# Patient Record
Sex: Male | Born: 1995 | Hispanic: Yes | Marital: Single | Smoking: Never smoker
Health system: Southern US, Community
[De-identification: ages and names within clinical notes are randomized; demographics above are authoritative.]

## PROBLEM LIST (undated history)

## (undated) DIAGNOSIS — F32A Depression, unspecified: Secondary | ICD-10-CM

## (undated) DIAGNOSIS — F419 Anxiety disorder, unspecified: Secondary | ICD-10-CM

## (undated) DIAGNOSIS — F329 Major depressive disorder, single episode, unspecified: Secondary | ICD-10-CM

## (undated) HISTORY — DX: Anxiety disorder, unspecified: F41.9

## (undated) HISTORY — DX: Depression, unspecified: F32.A

## (undated) HISTORY — PX: ANKLE SURGERY: SHX546

---
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
## 2019-02-09 ENCOUNTER — Encounter: Payer: Self-pay | Admitting: Family Medicine

## 2019-02-09 ENCOUNTER — Other Ambulatory Visit: Payer: Self-pay

## 2019-02-09 ENCOUNTER — Ambulatory Visit (INDEPENDENT_AMBULATORY_CARE_PROVIDER_SITE_OTHER): Payer: Managed Care, Other (non HMO) | Admitting: Family Medicine

## 2019-02-09 VITALS — BP 110/78 | HR 58 | Temp 97.9°F | Ht 73.0 in | Wt 192.0 lb

## 2019-02-09 DIAGNOSIS — F419 Anxiety disorder, unspecified: Secondary | ICD-10-CM

## 2019-02-09 DIAGNOSIS — F329 Major depressive disorder, single episode, unspecified: Secondary | ICD-10-CM | POA: Diagnosis not present

## 2019-02-09 DIAGNOSIS — Z7289 Other problems related to lifestyle: Secondary | ICD-10-CM | POA: Diagnosis not present

## 2019-02-09 DIAGNOSIS — Z7689 Persons encountering health services in other specified circumstances: Secondary | ICD-10-CM

## 2019-02-09 DIAGNOSIS — F32A Depression, unspecified: Secondary | ICD-10-CM

## 2019-02-09 NOTE — Patient Instructions (Signed)
Living With Depression Everyone experiences occasional disappointment, sadness, and loss in their lives. When you are feeling down, blue, or sad for at least 2 weeks in a row, it may mean that you have depression. Depression can affect your thoughts and feelings, relationships, daily activities, and physical health. It is caused by changes in the way your brain functions. If you receive a diagnosis of depression, your health care provider will tell you which type of depression you have and what treatment options are available to you. If you are living with depression, there are ways to help you recover from it and also ways to prevent it from coming back. How to cope with lifestyle changes Coping with stress     Stress is your body's reaction to life changes and events, both good and bad. Stressful situations may include:  Getting married.  The death of a spouse.  Losing a job.  Retiring.  Having a baby. Stress can last just a few hours or it can be ongoing. Stress can play a major role in depression, so it is important to learn both how to cope with stress and how to think about it differently. Talk with your health care provider or a counselor if you would like to learn more about stress reduction. He or she may suggest some stress reduction techniques, such as:  Music therapy. This can include creating music or listening to music. Choose music that you enjoy and that inspires you.  Mindfulness-based meditation. This kind of meditation can be done while sitting or walking. It involves being aware of your normal breaths, rather than trying to control your breathing.  Centering prayer. This is a kind of meditation that involves focusing on a spiritual word or phrase. Choose a word, phrase, or sacred image that is meaningful to you and that brings you peace.  Deep breathing. To do this, expand your stomach and inhale slowly through your nose. Hold your breath for 3-5 seconds, then exhale  slowly, allowing your stomach muscles to relax.  Muscle relaxation. This involves intentionally tensing muscles then relaxing them. Choose a stress reduction technique that fits your lifestyle and personality. Stress reduction techniques take time and practice to develop. Set aside 5-15 minutes a day to do them. Therapists can offer training in these techniques. The training may be covered by some insurance plans. Other things you can do to manage stress include:  Keeping a stress diary. This can help you learn what triggers your stress and ways to control your response.  Understanding what your limits are and saying no to requests or events that lead to a schedule that is too full.  Thinking about how you respond to certain situations. You may not be able to control everything, but you can control how you react.  Adding humor to your life by watching funny films or TV shows.  Making time for activities that help you relax and not feeling guilty about spending your time this way.  Medicines Your health care provider may suggest certain medicines if he or she feels that they will help improve your condition. Avoid using alcohol and other substances that may prevent your medicines from working properly (may interact). It is also important to:  Talk with your pharmacist or health care provider about all the medicines that you take, their possible side effects, and what medicines are safe to take together.  Make it your goal to take part in all treatment decisions (shared decision-making). This includes giving input on   the side effects of medicines. It is best if shared decision-making with your health care provider is part of your total treatment plan. If your health care provider prescribes a medicine, you may not notice the full benefits of it for 4-8 weeks. Most people who are treated for depression need to be on medicine for at least 6-12 months after they feel better. If you are taking  medicines as part of your treatment, do not stop taking medicines without first talking to your health care provider. You may need to have the medicine slowly decreased (tapered) over time to decrease the risk of harmful side effects. Relationships Your health care provider may suggest family therapy along with individual therapy and drug therapy. While there may not be family problems that are causing you to feel depressed, it is still important to make sure your family learns as much as they can about your mental health. Having your family's support can help make your treatment successful. How to recognize changes in your condition Everyone has a different response to treatment for depression. Recovery from major depression happens when you have not had signs of major depression for two months. This may mean that you will start to:  Have more interest in doing activities.  Feel less hopeless than you did 2 months ago.  Have more energy.  Overeat less often, or have better or improving appetite.  Have better concentration. Your health care provider will work with you to decide the next steps in your recovery. It is also important to recognize when your condition is getting worse. Watch for these signs:  Having fatigue or low energy.  Eating too much or too little.  Sleeping too much or too little.  Feeling restless, agitated, or hopeless.  Having trouble concentrating or making decisions.  Having unexplained physical complaints.  Feeling irritable, angry, or aggressive. Get help as soon as you or your family members notice these symptoms coming back. How to get support and help from others How to talk with friends and family members about your condition  Talking to friends and family members about your condition can provide you with one way to get support and guidance. Reach out to trusted friends or family members, explain your symptoms to them, and let them know that you are  working with a health care provider to treat your depression. Financial resources Not all insurance plans cover mental health care, so it is important to check with your insurance carrier. If paying for co-pays or counseling services is a problem, search for a local or county mental health care center. They may be able to offer public mental health care services at low or no cost when you are not able to see a private health care provider. If you are taking medicine for depression, you may be able to get the generic form, which may be less expensive. Some makers of prescription medicines also offer help to patients who cannot afford the medicines they need. Follow these instructions at home:   Get the right amount and quality of sleep.  Cut down on using caffeine, tobacco, alcohol, and other potentially harmful substances.  Try to exercise, such as walking or lifting small weights.  Take over-the-counter and prescription medicines only as told by your health care provider.  Eat a healthy diet that includes plenty of vegetables, fruits, whole grains, low-fat dairy products, and lean protein. Do not eat a lot of foods that are high in solid fats, added sugars, or salt.    Keep all follow-up visits as told by your health care provider. This is important. Contact a health care provider if:  You stop taking your antidepressant medicines, and you have any of these symptoms: ? Nausea. ? Headache. ? Feeling lightheaded. ? Chills and body aches. ? Not being able to sleep (insomnia).  You or your friends and family think your depression is getting worse. Get help right away if:  You have thoughts of hurting yourself or others. If you ever feel like you may hurt yourself or others, or have thoughts about taking your own life, get help right away. You can go to your nearest emergency department or call:  Your local emergency services (911 in the [place]  A suicide crisis helpline, such as the  National Suicide Prevention Lifeline at (709)243-8295. This is open 24-hours a day. Summary  If you are living with depression, there are ways to help you recover from it and also ways to prevent it from coming back.  Work with your health care team to create a management plan that includes counseling, stress management techniques, and healthy lifestyle habits. This information is not intended to replace advice given to you by your health care provider. Make sure you discuss any questions you have with your health care provider. Document Revised: 04/22/2018 Document Reviewed: 12/02/2015 Elsevier Patient Education  2020 [employer] Inc.  Managing Anxiety, Adult After being diagnosed with an anxiety disorder, you may be relieved to know why you have felt or behaved a certain way. You may also feel overwhelmed about the treatment ahead and what it will mean for your life. With care and support, you can manage this condition and recover from it. How to manage lifestyle changes Managing stress and anxiety  Stress is your body's reaction to life changes and events, both good and bad. Most stress will last just a few hours, but stress can be ongoing and can lead to more than just stress. Although stress can play a major role in anxiety, it is not the same as anxiety. Stress is usually caused by something external, such as a deadline, test, or competition. Stress normally passes after the triggering event has ended.  Anxiety is caused by something internal, such as imagining a terrible outcome or worrying that something will go wrong that will devastate you. Anxiety often does not go away even after the triggering event is over, and it can become long-term (chronic) worry. It is important to understand the differences between stress and anxiety and to manage your stress effectively so that it does not lead to an anxious response. Talk with your health care provider or a counselor to learn more about reducing  anxiety and stress. He or she may suggest tension reduction techniques, such as:  Music therapy. This can include creating or listening to music that you enjoy and that inspires you.  Mindfulness-based meditation. This involves being aware of your normal breaths while not trying to control your breathing. It can be done while sitting or walking.  Centering prayer. This involves focusing on a word, phrase, or sacred image that means something to you and brings you peace.  Deep breathing. To do this, expand your stomach and inhale slowly through your nose. Hold your breath for 3-5 seconds. Then exhale slowly, letting your stomach muscles relax.  Self-talk. This involves identifying thought patterns that lead to anxiety reactions and changing those patterns.  Muscle relaxation. This involves tensing muscles and then relaxing them. Choose a tension reduction technique that  suits your lifestyle and personality. These techniques take time and practice. Set aside 5-15 minutes a day to do them. Therapists can offer counseling and training in these techniques. The training to help with anxiety may be covered by some insurance plans. Other things you can do to manage stress and anxiety include:  Keeping a stress/anxiety diary. This can help you learn what triggers your reaction and then learn ways to manage your response.  Thinking about how you react to certain situations. You may not be able to control everything, but you can control your response.  Making time for activities that help you relax and not feeling guilty about spending your time in this way.  Visual imagery and yoga can help you stay calm and relax.  Medicines Medicines can help ease symptoms. Medicines for anxiety include:  Anti-anxiety drugs.  Antidepressants. Medicines are often used as a primary treatment for anxiety disorder. Medicines will be prescribed by a health care provider. When used together, medicines, psychotherapy,  and tension reduction techniques may be the most effective treatment. Relationships Relationships can play a big part in helping you recover. Try to spend more time connecting with trusted friends and family members. Consider going to couples counseling, taking family education classes, or going to family therapy. Therapy can help you and others better understand your condition. How to recognize changes in your anxiety Everyone responds differently to treatment for anxiety. Recovery from anxiety happens when symptoms decrease and stop interfering with your daily activities at home or work. This may mean that you will start to:  Have better concentration and focus. Worry will interfere less in your daily thinking.  Sleep better.  Be less irritable.  Have more energy.  Have improved memory. It is important to recognize when your condition is getting worse. Contact your health care provider if your symptoms interfere with home or work and you feel like your condition is not improving. Follow these instructions at home: Activity  Exercise. Most adults should do the following: ? Exercise for at least 150 minutes each week. The exercise should increase your heart rate and make you sweat (moderate-intensity exercise). ? Strengthening exercises at least twice a week.  Get the right amount and quality of sleep. Most adults need 7-9 hours of sleep each night. Lifestyle   Eat a healthy diet that includes plenty of vegetables, fruits, whole grains, low-fat dairy products, and lean protein. Do not eat a lot of foods that are high in solid fats, added sugars, or salt.  Make choices that simplify your life.  Do not use any products that contain nicotine or tobacco, such as cigarettes, e-cigarettes, and chewing tobacco. If you need help quitting, ask your health care provider.  Avoid caffeine, alcohol, and certain over-the-counter cold medicines. These may make you feel worse. Ask your pharmacist  which medicines to avoid. General instructions  Take over-the-counter and prescription medicines only as told by your health care provider.  Keep all follow-up visits as told by your health care provider. This is important. Where to find support You can get help and support from these sources:  Self-help groups.  Online and [employer].  A trusted spiritual leader.  Couples counseling.  Family education classes.  Family therapy. Where to find more information You may find that joining a support group helps you deal with your anxiety. The following sources can help you locate counselors or support groups near you:  [place]: www.mentalhealth[place].net  Anxiety and Depression Association of [place] (  ADAA): ProgramCam.de  [employer] on Mental Illness (NAMI): www.nami.org Contact a health care provider if you:  Have a hard time staying focused or finishing daily tasks.  Spend many hours a day feeling worried about everyday life.  Become exhausted by worry.  Start to have headaches, feel tense, or have nausea.  Urinate more than normal.  Have diarrhea. Get help right away if you have:  A racing heart and shortness of breath.  Thoughts of hurting yourself or others. If you ever feel like you may hurt yourself or others, or have thoughts about taking your own life, get help right away. You can go to your nearest emergency department or call:  Your local emergency services (911 in the [place]  A suicide crisis helpline, such as the National Suicide Prevention Lifeline at 212-296-6198. This is open 24 hours a day. Summary  Taking steps to learn and use tension reduction techniques can help calm you and help prevent triggering an anxiety reaction.  When used together, medicines, psychotherapy, and tension reduction techniques may be the most effective treatment.  Family, friends, and partners can play a big part in helping you recover  from an anxiety disorder. This information is not intended to replace advice given to you by your health care provider. Make sure you discuss any questions you have with your health care provider. Document Revised: 05/31/2018 Document Reviewed: 05/31/2018 Elsevier Patient Education  2020 [employer].

## 2019-02-09 NOTE — Progress Notes (Signed)
Patient presents to clinic today to f/u on chronic issues and establish care.  SUBJECTIVE: PMH: Pt is a 24 yo male with pmh sig for depression.  Pt previously seen at [facility].  H/o depression: -states has symptoms off and on.   -worse during HS -endorses hospitalization x 1 wk -endorses h/o anger, anxiety, SI, and self harm in the past -currently denies SI/HI -in the past saw Psychiatry, but states he "does not trust them" -keeps "everything inside" -states does not need sleep.  May drink 2 Monster energy drinks per day if working -states mood is good.    Allergies: NKDA  Social history: Patient works-instructed to look infected.  Pt's shift is from  230 pm to 10 pm.  Pt endorses vaping.  States does not vape if drinks a monster.  Also notes MJ use.    Health Maintenance: Dental --[facility] Immunizations -- influenza vaccine 2020  Family medical history: Mom-alive Dad-alive, history of prostate cancer, diabetes, HLD, HTN Sister-Jennifer, alive, depression MGM-alive, arthritis, depression, heart disease, HLD, kidney disease, mental illness   Past Medical History:  Diagnosis Date  . Depression     No current outpatient medications on file prior to visit.   No current facility-administered medications on file prior to visit.    No Known Allergies  No family history on file.  Social History   Socioeconomic History  . Marital status: Unknown    Spouse name: Not on file  . Number of children: Not on file  . Years of education: Not on file  . Highest education level: Not on file  Occupational History  . Not on file  Tobacco Use  . Smoking status: Never Smoker  . Smokeless tobacco: Never Used  Substance and Sexual Activity  . Alcohol use: Never  . Drug use: Never  . Sexual activity: Not Currently  Other Topics Concern  . Not on file  Social History Narrative  . Not on file   Social Determinants of Health    Financial Resource Strain:   . Difficulty of Paying Living Expenses: Not on file  Food Insecurity:   . Worried About Charity fundraiser in the Last Year: Not on file  . Ran Out of Food in the Last Year: Not on file  Transportation Needs:   . Lack of Transportation (Medical): Not on file  . Lack of Transportation (Non-Medical): Not on file  Physical Activity:   . Days of Exercise per Week: Not on file  . Minutes of Exercise per Session: Not on file  Stress:   . Feeling of Stress : Not on file  Social Connections:   . Frequency of Communication with Friends and Family: Not on file  . Frequency of Social Gatherings with Friends and Family: Not on file  . Attends Religious Services: Not on file  . Active Member of Clubs or Organizations: Not on file  . Attends Archivist Meetings: Not on file  . Marital Status: Not on file  Intimate Partner Violence:   . Fear of Current or Ex-Partner: Not on file  . Emotionally Abused: Not on file  . Physically Abused: Not on file  . Sexually Abused: Not on file    ROS General: Denies fever, chills, night sweats, changes in weight, changes in appetite HEENT: Denies headaches, ear pain, changes in vision, rhinorrhea, sore throat CV: Denies CP, palpitations, SOB, orthopnea Pulm: Denies SOB, cough, wheezing GI: Denies abdominal pain, nausea,  vomiting, diarrhea, constipation GU: Denies dysuria, hematuria, frequency, vaginal discharge Msk: Denies muscle cramps, joint pains Neuro: Denies weakness, numbness, tingling Skin: Denies rashes, bruising Psych: Denies hallucinations  +depression, anxiety  BP 110/78 (BP Location: Left Arm, Patient Position: Sitting, Cuff Size: Normal)   Pulse (!) 58   Temp 97.9 F (36.6 C) (Temporal)   Ht 6\' 1"  (1.854 m)   Wt 192 lb (87.1 kg)   SpO2 98%   BMI 25.33 kg/m   Physical Exam Gen. Pleasant, well developed, well-nourished, in NAD HEENT - [place]/AT, PERRL, conjunctive clear, no scleral icterus, no  nasal drainage, pharynx without erythema or exudate.  TMs normal b/l. Lungs: no use of accessory muscles, CTAB, no wheezes, rales or rhonchi Cardiovascular: RRR, No r/g/m, no peripheral edema Abdomen: BS present, soft, nontender,nondistended Musculoskeletal: No deformities, moves all four extremities, no cyanosis or clubbing, normal tone Neuro:  A&Ox3, CN II-XII intact, normal gait Skin:  Warm, dry, intact, no lesions Psych: flat affect, mood appropriate  No results found for this or any previous visit (from the past 2160 hour(s)).  Assessment/Plan: Anxiety and depression -PHQ 9 score 7 -GAD 7 score 9 -discussed BH.  Pt declines. -continue to monitor -given precautions  Encounter to establish care -We reviewed the PMH, PSH, FH, SH, Meds and Allergies. -We provided refills for any medications we will prescribe as needed. -We addressed current concerns per orders and patient instructions. -We have asked for records for pertinent exams, studies, vaccines and notes from previous providers. -We have advised patient to follow up per instructions below.  Engages in vaping -discussed smoking cessation >3 min, <10 min -not interested in quitting at this time. -will continue to address at each visit  F/u in 1 month  2161, MD

## 2019-04-12 ENCOUNTER — Encounter: Payer: Managed Care, Other (non HMO) | Admitting: Family Medicine

## 2019-06-14 ENCOUNTER — Encounter: Payer: Managed Care, Other (non HMO) | Admitting: Family Medicine

## 2019-07-13 ENCOUNTER — Encounter: Payer: Self-pay | Admitting: Family Medicine

## 2019-07-13 ENCOUNTER — Other Ambulatory Visit: Payer: Self-pay

## 2019-07-13 ENCOUNTER — Ambulatory Visit (INDEPENDENT_AMBULATORY_CARE_PROVIDER_SITE_OTHER): Payer: Managed Care, Other (non HMO) | Admitting: Family Medicine

## 2019-07-13 VITALS — BP 108/72 | HR 74 | Temp 97.9°F | Ht 73.0 in | Wt 220.0 lb

## 2019-07-13 DIAGNOSIS — Z13220 Encounter for screening for lipoid disorders: Secondary | ICD-10-CM | POA: Diagnosis not present

## 2019-07-13 DIAGNOSIS — F321 Major depressive disorder, single episode, moderate: Secondary | ICD-10-CM

## 2019-07-13 DIAGNOSIS — K5909 Other constipation: Secondary | ICD-10-CM

## 2019-07-13 DIAGNOSIS — Z0001 Encounter for general adult medical examination with abnormal findings: Secondary | ICD-10-CM | POA: Diagnosis not present

## 2019-07-13 DIAGNOSIS — Z131 Encounter for screening for diabetes mellitus: Secondary | ICD-10-CM

## 2019-07-13 DIAGNOSIS — Z0000 Encounter for general adult medical examination without abnormal findings: Secondary | ICD-10-CM

## 2019-07-13 DIAGNOSIS — R4586 Emotional lability: Secondary | ICD-10-CM

## 2019-07-13 DIAGNOSIS — Z23 Encounter for immunization: Secondary | ICD-10-CM | POA: Diagnosis not present

## 2019-07-13 DIAGNOSIS — F419 Anxiety disorder, unspecified: Secondary | ICD-10-CM

## 2019-07-13 LAB — CBC WITH DIFFERENTIAL/PLATELET
Basophils Absolute: 0 10*3/uL (ref 0.0–0.1)
Basophils Relative: 0.7 % (ref 0.0–3.0)
Eosinophils Absolute: 0.1 10*3/uL (ref 0.0–0.7)
Eosinophils Relative: 2.5 % (ref 0.0–5.0)
HCT: 40.3 % (ref 39.0–52.0)
Hemoglobin: 13.4 g/dL (ref 13.0–17.0)
Lymphocytes Relative: 31.8 % (ref 12.0–46.0)
Lymphs Abs: 1.9 10*3/uL (ref 0.7–4.0)
MCHC: 33.2 g/dL (ref 30.0–36.0)
MCV: 81.8 fl (ref 78.0–100.0)
Monocytes Absolute: 0.5 10*3/uL (ref 0.1–1.0)
Monocytes Relative: 7.8 % (ref 3.0–12.0)
Neutro Abs: 3.5 10*3/uL (ref 1.4–7.7)
Neutrophils Relative %: 57.2 % (ref 43.0–77.0)
Platelets: 258 10*3/uL (ref 150.0–400.0)
RBC: 4.93 Mil/uL (ref 4.22–5.81)
RDW: 14 % (ref 11.5–15.5)
WBC: 6.1 10*3/uL (ref 4.0–10.5)

## 2019-07-13 LAB — COMPREHENSIVE METABOLIC PANEL
ALT: 54 U/L — ABNORMAL HIGH (ref 0–53)
AST: 33 U/L (ref 0–37)
Albumin: 4.3 g/dL (ref 3.5–5.2)
Alkaline Phosphatase: 132 U/L — ABNORMAL HIGH (ref 39–117)
BUN: 9 mg/dL (ref 6–23)
CO2: 29 mEq/L (ref 19–32)
Calcium: 9 mg/dL (ref 8.4–10.5)
Chloride: 101 mEq/L (ref 96–112)
Creatinine, Ser: 0.56 mg/dL (ref 0.40–1.50)
GFR: 177.67 mL/min (ref >60.00)
Glucose, Bld: 88 mg/dL (ref 70–99)
Potassium: 4 mEq/L (ref 3.5–5.1)
Sodium: 137 mEq/L (ref 135–145)
Total Bilirubin: 0.9 mg/dL (ref 0.2–1.2)
Total Protein: 7.4 g/dL (ref 6.0–8.3)

## 2019-07-13 LAB — LIPID PANEL
Cholesterol: 185 mg/dL (ref 0–200)
HDL: 37.8 mg/dL — ABNORMAL LOW (ref >39.00)
LDL Cholesterol: 123 mg/dL — ABNORMAL HIGH (ref 0–99)
NonHDL: 147.44
Total CHOL/HDL Ratio: 5
Triglycerides: 121 mg/dL (ref 0.0–149.0)
VLDL: 24.2 mg/dL (ref 0.0–40.0)

## 2019-07-13 LAB — HEMOGLOBIN A1C: Hgb A1c MFr Bld: 5.7 % (ref 4.6–6.5)

## 2019-07-13 LAB — VITAMIN B12: Vitamin B-12: 362 pg/mL (ref 211–911)

## 2019-07-13 LAB — T4, FREE: Free T4: 0.81 ng/dL (ref 0.60–1.60)

## 2019-07-13 LAB — TSH: TSH: 1.51 u[IU]/mL (ref 0.35–4.50)

## 2019-07-13 NOTE — Patient Instructions (Addendum)
You can take MiraLAX as needed for constipation.  It can be found over-the-counter at your local drugstore.  Preventive Care 38-25 Years Old, Male Preventive care refers to lifestyle choices and visits with your health care provider that can promote health and wellness. This includes:  A yearly physical exam. This is also called an annual well check.  Regular dental and eye exams.  Immunizations.  Screening for certain conditions.  Healthy lifestyle choices, such as eating a healthy diet, getting regular exercise, not using drugs or products that contain nicotine and tobacco, and limiting alcohol use. What can I expect for my preventive care visit? Physical exam Your health care provider will check:  Height and weight. These may be used to calculate body mass index (BMI), which is a measurement that tells if you are at a healthy weight.  Heart rate and blood pressure.  Your skin for abnormal spots. Counseling Your health care provider may ask you questions about:  Alcohol, tobacco, and drug use.  Emotional well-being.  Home and relationship well-being.  Sexual activity.  Eating habits.  Work and work Statistician. What immunizations do I need?  Influenza (flu) vaccine  This is recommended every year. Tetanus, diphtheria, and pertussis (Tdap) vaccine  You may need a Td booster every 10 years. Varicella (chickenpox) vaccine  You may need this vaccine if you have not already been vaccinated. Human papillomavirus (HPV) vaccine  If recommended by your health care provider, you may need three doses over 6 months. Measles, mumps, and rubella (MMR) vaccine  You may need at least one dose of MMR. You may also need a second dose. Meningococcal conjugate (MenACWY) vaccine  One dose is recommended if you are 61-78 years old and a Market researcher living in a residence hall, or if you have one of several medical conditions. You may also need additional booster  doses. Pneumococcal conjugate (PCV13) vaccine  You may need this if you have certain conditions and were not previously vaccinated. Pneumococcal polysaccharide (PPSV23) vaccine  You may need one or two doses if you smoke cigarettes or if you have certain conditions. Hepatitis A vaccine  You may need this if you have certain conditions or if you travel or work in places where you may be exposed to hepatitis A. Hepatitis B vaccine  You may need this if you have certain conditions or if you travel or work in places where you may be exposed to hepatitis B. Haemophilus influenzae type b (Hib) vaccine  You may need this if you have certain risk factors. You may receive vaccines as individual doses or as more than one vaccine together in one shot (combination vaccines). Talk with your health care provider about the risks and benefits of combination vaccines. What tests do I need? Blood tests  Lipid and cholesterol levels. These may be checked every 5 years starting at age 29.  Hepatitis C test.  Hepatitis B test. Screening   Diabetes screening. This is done by checking your blood sugar (glucose) after you have not eaten for a while (fasting).  Sexually transmitted disease (STD) testing. Talk with your health care provider about your test results, treatment options, and if necessary, the need for more tests. Follow these instructions at home: Eating and drinking   Eat a diet that includes fresh fruits and vegetables, whole grains, lean protein, and low-fat dairy products.  Take vitamin and mineral supplements as recommended by your health care provider.  Do not drink alcohol if your health care  provider tells you not to drink.  If you drink alcohol: ? Limit how much you have to 0-2 drinks a day. ? Be aware of how much alcohol is in your drink. In the [place] one drink equals one 12 oz bottle of beer (355 mL), one 5 oz glass of wine (148 mL), or one 1 oz glass of hard liquor (44  mL). Lifestyle  Take daily care of your teeth and gums.  Stay active. Exercise for at least 30 minutes on 5 or more days each week.  Do not use any products that contain nicotine or tobacco, such as cigarettes, e-cigarettes, and chewing tobacco. If you need help quitting, ask your health care provider.  If you are sexually active, practice safe sex. Use a condom or other form of protection to prevent STIs (sexually transmitted infections). What's next?  Go to your health care provider once a year for a well check visit.  Ask your health care provider how often you should have your eyes and teeth checked.  Stay up to date on all vaccines. This information is not intended to replace advice given to you by your health care provider. Make sure you discuss any questions you have with your health care provider. Document Revised: 12/23/2017 Document Reviewed: 12/23/2017 Elsevier Patient Education  [place], Adult After being diagnosed with an anxiety disorder, you may be relieved to know why you have felt or behaved a certain way. You may also feel overwhelmed about the treatment ahead and what it will mean for your life. With care and support, you can manage this condition and recover from it. How to manage lifestyle changes Managing stress and anxiety  Stress is your body's reaction to life changes and events, both good and bad. Most stress will last just a few hours, but stress can be ongoing and can lead to more than just stress. Although stress can play a major role in anxiety, it is not the same as anxiety. Stress is usually caused by something external, such as a deadline, test, or competition. Stress normally passes after the triggering event has ended.  Anxiety is caused by something internal, such as imagining a terrible outcome or worrying that something will go wrong that will devastate you. Anxiety often does not go away even after the triggering event is  over, and it can become long-term (chronic) worry. It is important to understand the differences between stress and anxiety and to manage your stress effectively so that it does not lead to an anxious response. Talk with your health care provider or a counselor to learn more about reducing anxiety and stress. He or she may suggest tension reduction techniques, such as:  Music therapy. This can include creating or listening to music that you enjoy and that inspires you.  Mindfulness-based meditation. This involves being aware of your normal breaths while not trying to control your breathing. It can be done while sitting or walking.  Centering prayer. This involves focusing on a word, phrase, or sacred image that means something to you and brings you peace.  Deep breathing. To do this, expand your stomach and inhale slowly through your nose. Hold your breath for 3-5 seconds. Then exhale slowly, letting your stomach muscles relax.  Self-talk. This involves identifying thought patterns that lead to anxiety reactions and changing those patterns.  Muscle relaxation. This involves tensing muscles and then relaxing them. Choose a tension reduction technique that suits your lifestyle and personality. These techniques  take time and practice. Set aside 5-15 minutes a day to do them. Therapists can offer counseling and training in these techniques. The training to help with anxiety may be covered by some insurance plans. Other things you can do to manage stress and anxiety include:  Keeping a stress/anxiety diary. This can help you learn what triggers your reaction and then learn ways to manage your response.  Thinking about how you react to certain situations. You may not be able to control everything, but you can control your response.  Making time for activities that help you relax and not feeling guilty about spending your time in this way.  Visual imagery and yoga can help you stay calm and  relax.  Medicines Medicines can help ease symptoms. Medicines for anxiety include:  Anti-anxiety drugs.  Antidepressants. Medicines are often used as a primary treatment for anxiety disorder. Medicines will be prescribed by a health care provider. When used together, medicines, psychotherapy, and tension reduction techniques may be the most effective treatment. Relationships Relationships can play a big part in helping you recover. Try to spend more time connecting with trusted friends and family members. Consider going to couples counseling, taking family education classes, or going to family therapy. Therapy can help you and others better understand your condition. How to recognize changes in your anxiety Everyone responds differently to treatment for anxiety. Recovery from anxiety happens when symptoms decrease and stop interfering with your daily activities at home or work. This may mean that you will start to:  Have better concentration and focus. Worry will interfere less in your daily thinking.  Sleep better.  Be less irritable.  Have more energy.  Have improved memory. It is important to recognize when your condition is getting worse. Contact your health care provider if your symptoms interfere with home or work and you feel like your condition is not improving. Follow these instructions at home: Activity  Exercise. Most adults should do the following: ? Exercise for at least 150 minutes each week. The exercise should increase your heart rate and make you sweat (moderate-intensity exercise). ? Strengthening exercises at least twice a week.  Get the right amount and quality of sleep. Most adults need 7-9 hours of sleep each night. Lifestyle   Eat a healthy diet that includes plenty of vegetables, fruits, whole grains, low-fat dairy products, and lean protein. Do not eat a lot of foods that are high in solid fats, added sugars, or salt.  Make choices that simplify your  life.  Do not use any products that contain nicotine or tobacco, such as cigarettes, e-cigarettes, and chewing tobacco. If you need help quitting, ask your health care provider.  Avoid caffeine, alcohol, and certain over-the-counter cold medicines. These may make you feel worse. Ask your pharmacist which medicines to avoid. General instructions  Take over-the-counter and prescription medicines only as told by your health care provider.  Keep all follow-up visits as told by your health care provider. This is important. Where to find support You can get help and support from these sources:  Self-help groups.  Online and [employer].  A trusted spiritual leader.  Couples counseling.  Family education classes.  Family therapy. Where to find more information You may find that joining a support group helps you deal with your anxiety. The following sources can help you locate counselors or support groups near you:  [place]: www.mentalhealthamerica.net  Anxiety and Depression Association of [place] (ADAA): https://www.clark.net/  National Alliance on Mental  Illness (NAMI): www.nami.org Contact a health care provider if you:  Have a hard time staying focused or finishing daily tasks.  Spend many hours a day feeling worried about everyday life.  Become exhausted by worry.  Start to have headaches, feel tense, or have nausea.  Urinate more than normal.  Have diarrhea. Get help right away if you have:  A racing heart and shortness of breath.  Thoughts of hurting yourself or others. If you ever feel like you may hurt yourself or others, or have thoughts about taking your own life, get help right away. You can go to your nearest emergency department or call:  Your local emergency services (911 in the [place]  A suicide crisis helpline, such as the [facility] at (930) 009-1284. This is open 24 hours a day. Summary  Taking steps to  learn and use tension reduction techniques can help calm you and help prevent triggering an anxiety reaction.  When used together, medicines, psychotherapy, and tension reduction techniques may be the most effective treatment.  Family, friends, and partners can play a big part in helping you recover from an anxiety disorder. This information is not intended to replace advice given to you by your health care provider. Make sure you discuss any questions you have with your health care provider. Document Revised: 05/31/2018 Document Reviewed: 05/31/2018 Elsevier Patient Education  [place] With Depression Everyone experiences occasional disappointment, sadness, and loss in their lives. When you are feeling down, blue, or sad for at least 2 weeks in a row, it may mean that you have depression. Depression can affect your thoughts and feelings, relationships, daily activities, and physical health. It is caused by changes in the way your brain functions. If you receive a diagnosis of depression, your health care provider will tell you which type of depression you have and what treatment options are available to you. If you are living with depression, there are ways to help you recover from it and also ways to prevent it from coming back. How to cope with lifestyle changes Coping with stress     Stress is your body's reaction to life changes and events, both good and bad. Stressful situations may include:  Getting married.  The death of a spouse.  Losing a job.  Retiring.  Having a baby. Stress can last just a few hours or it can be ongoing. Stress can play a major role in depression, so it is important to learn both how to cope with stress and how to think about it differently. Talk with your health care provider or a counselor if you would like to learn more about stress reduction. He or she may suggest some stress reduction techniques, such as:  Music therapy. This can  include creating music or listening to music. Choose music that you enjoy and that inspires you.  Mindfulness-based meditation. This kind of meditation can be done while sitting or walking. It involves being aware of your normal breaths, rather than trying to control your breathing.  Centering prayer. This is a kind of meditation that involves focusing on a spiritual word or phrase. Choose a word, phrase, or sacred image that is meaningful to you and that brings you peace.  Deep breathing. To do this, expand your stomach and inhale slowly through your nose. Hold your breath for 3-5 seconds, then exhale slowly, allowing your stomach muscles to relax.  Muscle relaxation. This involves intentionally tensing muscles then relaxing them. Choose a  stress reduction technique that fits your lifestyle and personality. Stress reduction techniques take time and practice to develop. Set aside 5-15 minutes a day to do them. Therapists can offer training in these techniques. The training may be covered by some insurance plans. Other things you can do to manage stress include:  Keeping a stress diary. This can help you learn what triggers your stress and ways to control your response.  Understanding what your limits are and saying no to requests or events that lead to a schedule that is too full.  Thinking about how you respond to certain situations. You may not be able to control everything, but you can control how you react.  Adding humor to your life by watching funny films or TV shows.  Making time for activities that help you relax and not feeling guilty about spending your time this way.  Medicines Your health care provider may suggest certain medicines if he or she feels that they will help improve your condition. Avoid using alcohol and other substances that may prevent your medicines from working properly (may interact). It is also important to:  Talk with your pharmacist or health care provider  about all the medicines that you take, their possible side effects, and what medicines are safe to take together.  Make it your goal to take part in all treatment decisions (shared decision-making). This includes giving input on the side effects of medicines. It is best if shared decision-making with your health care provider is part of your total treatment plan. If your health care provider prescribes a medicine, you may not notice the full benefits of it for 4-8 weeks. Most people who are treated for depression need to be on medicine for at least 6-12 months after they feel better. If you are taking medicines as part of your treatment, do not stop taking medicines without first talking to your health care provider. You may need to have the medicine slowly decreased (tapered) over time to decrease the risk of harmful side effects. Relationships Your health care provider may suggest family therapy along with individual therapy and drug therapy. While there may not be family problems that are causing you to feel depressed, it is still important to make sure your family learns as much as they can about your mental health. Having your family's support can help make your treatment successful. How to recognize changes in your condition Everyone has a different response to treatment for depression. Recovery from major depression happens when you have not had signs of major depression for two months. This may mean that you will start to:  Have more interest in doing activities.  Feel less hopeless than you did 2 months ago.  Have more energy.  Overeat less often, or have better or improving appetite.  Have better concentration. Your health care provider will work with you to decide the next steps in your recovery. It is also important to recognize when your condition is getting worse. Watch for these signs:  Having fatigue or low energy.  Eating too much or too little.  Sleeping too much or too  little.  Feeling restless, agitated, or hopeless.  Having trouble concentrating or making decisions.  Having unexplained physical complaints.  Feeling irritable, angry, or aggressive. Get help as soon as you or your family members notice these symptoms coming back. How to get support and help from others How to talk with friends and family members about your condition  Talking to friends and family  members about your condition can provide you with one way to get support and guidance. Reach out to trusted friends or family members, explain your symptoms to them, and let them know that you are working with a health care provider to treat your depression. Financial resources Not all insurance plans cover mental health care, so it is important to check with your insurance carrier. If paying for co-pays or counseling services is a problem, search for a local or county mental health care center. They may be able to offer public mental health care services at low or no cost when you are not able to see a private health care provider. If you are taking medicine for depression, you may be able to get the generic form, which may be less expensive. Some makers of prescription medicines also offer help to patients who cannot afford the medicines they need. Follow these instructions at home:   Get the right amount and quality of sleep.  Cut down on using caffeine, tobacco, alcohol, and other potentially harmful substances.  Try to exercise, such as walking or lifting small weights.  Take over-the-counter and prescription medicines only as told by your health care provider.  Eat a healthy diet that includes plenty of vegetables, fruits, whole grains, low-fat dairy products, and lean protein. Do not eat a lot of foods that are high in solid fats, added sugars, or salt.  Keep all follow-up visits as told by your health care provider. This is important. Contact a health care provider if:  You stop  taking your antidepressant medicines, and you have any of these symptoms: ? Nausea. ? Headache. ? Feeling lightheaded. ? Chills and body aches. ? Not being able to sleep (insomnia).  You or your friends and family think your depression is getting worse. Get help right away if:  You have thoughts of hurting yourself or others. If you ever feel like you may hurt yourself or others, or have thoughts about taking your own life, get help right away. You can go to your nearest emergency department or call:  Your local emergency services (911 in the [place]  A suicide crisis helpline, such as the [facility] at 651-187-4160. This is open 24-hours a day. Summary  If you are living with depression, there are ways to help you recover from it and also ways to prevent it from coming back.  Work with your health care team to create a management plan that includes counseling, stress management techniques, and healthy lifestyle habits. This information is not intended to replace advice given to you by your health care provider. Make sure you discuss any questions you have with your health care provider. Document Revised: 04/22/2018 Document Reviewed: 12/02/2015 Elsevier Patient Education  2020 [employer].  Chronic Constipation  Chronic constipation is a condition in which a person has three or fewer bowel movements a week, for three months or longer. This condition is especially common in older adults. The two main kinds of chronic constipation are secondary constipation and functional constipation. Secondary constipation results from another condition or a treatment. Functional constipation, also called primary or idiopathic constipation, is divided into three types:  Normal transit constipation. In this type, movement of stool through the colon (stool transit) occurs normally.  Slow transit constipation. In this type, stool moves slowly through the colon.  Outlet  constipation or pelvic floor dysfunction. In this type, the nerves and muscles that empty the rectum do not work normally. What are the causes?  Causes of secondary constipation may include:  Failing to drink enough fluid, eat enough food or fiber, or get physically active.  Pregnancy.  A tear in the anus (anal fissure).  Blockage in the bowel (bowel obstruction).  Narrowing of the bowel (bowel stricture).  Having a long-term medical condition, such as: ? Diabetes. ? Hypothyroidism. ? Multiple sclerosis. ? Parkinson disease. ? Stroke. ? Spinal cord injury. ? Dementia. ? Colon cancer. ? Inflammatory bowel disease (IBD). ? Iron-deficiency anemia. ? Outward collapse of the rectum (rectal prolapse). ? Hemorrhoids.  Taking certain medicines, including: ? Narcotics. These are a certain type of prescription pain medicine. ? Antacids. ? Iron supplements. ? Water pills (diuretics). ? Certain blood pressure medicines. ? Anti-seizure medicines. ? Antidepressants. ? Medicines for Parkinson disease. The cause of functional constipation is not known, but some conditions are associated with it. These conditions include:  Stress.  Problems in the nerves and muscles that control stool transit.  Weak or impaired pelvic floor muscles. What increases the risk? You may be at higher risk for chronic constipation if you:  Are older than age 37.  Are male.  Live in a long-term care facility.  Do not get much exercise or physical activity (have a sedentary lifestyle).  Do not drink enough fluids.  Do not eat enough food, especially fiber.  Have a long-term disease.  Have a mental health disorder or eating disorder.  Take many medicines. What are the signs or symptoms? The main symptom of chronic constipation is having three or fewer bowel movements a week for several weeks. Other signs and symptoms may vary from person to person. These include:  Pushing hard (straining) to  pass stool.  Painful bowel movements.  Having hard or lumpy stools.  Having lower belly discomfort, such as cramps or bloating.  Being unable to have a bowel movement when you feel the urge.  Feeling like you still need to pass stool after a bowel movement.  Feeling that you have something in your rectum that is blocking or preventing bowel movements.  Seeing blood on the toilet paper or in your stool.  Worsening confusion (in older adults). How is this diagnosed? This condition may be diagnosed based on:  Symptoms and medical history. You will be asked about your symptoms, lifestyle, diet, and any medicines that you are taking.  Physical exam. ? Your belly (abdomen) will be examined. ? A digital rectal exam may be done. For this exam, a health care provider places a lubricated, gloved finger into the rectum.  Other tests to check for any underlying causes of your constipation. These may be ordered if you have bleeding in your rectum, weight loss, or a family history of colon cancer. In these cases, you may have: ? Imaging studies of the colon. These may include X-ray, ultrasound, or CT scan. ? Blood tests. ? A procedure to examine the inside of your colon (colonoscopy). ? More specialized tests to check:  Whether your anal sphincter works well. This is a ring-shaped muscle that controls the closing of the anus.  How well food moves through your colon. ? Tests to measure the nerve signal in your pelvic floor muscles (electromyography). How is this treated? Treatment for chronic constipation depends on the cause. Most often, treatment starts with:  Being more active and getting regular exercise.  Drinking more fluids.  Adding fiber to your diet. Sources of fiber include fruits, vegetables, whole grains, and fiber supplements.  Using medicines such as stool softeners or  medicines that increase contractions in your digestive system (pro-motility agents).  Training your  pelvic muscles with biofeedback.  Surgery, if there is obstruction. Treatment for secondary chronic constipation depends on the underlying condition. You may need to:  Stop or change some medicines if they cause constipation.  Use a fiber supplement (bulk laxative) or stool softener.  Use prescription laxative. This works by [employer] into your colon (osmotic laxative). You may also need to see a specialist who treats conditions of the digestive system (gastroenterologist). Follow these instructions at home:   Take over-the-counter and prescription medicines only as told by your health care provider.  If you are taking a laxative, take it as told by your health care provider.  Eat a balanced diet that includes enough fiber. Ask your health care provider to recommend a diet that is right for you.  Drink clear fluids, especially water. Avoid drinking alcohol, caffeine, and soda.  Drink enough fluid to keep your urine pale yellow.  Get some physical activity every day. Ask your health care provider what physical activities are safe for you.  Get colon cancer screenings as told by your health care provider.  Keep all follow-up visits as told by your health care provider. This is important. Contact a health care provider if:  You are having three or fewer bowel movements a week.  Your stools are hard or lumpy.  You notice blood on the toilet paper or in your stool after you have a bowel movement.  You have unexplained weight loss.  You have rectum (rectal) pain.  You have stool leakage.  You experience nausea or vomiting. Get help right away if:  You have rectal bleeding or you pass blood clots.  You have severe rectal pain.  You have body tissue that pushes out (protrudes) from your anus.  You have severe pain or bloating (distension) in your abdomen.  You have vomiting that you cannot control. Summary  Chronic constipation is a condition in which a person  has three or fewer bowel movements a week, for three months or longer.  You may have a higher risk for this condition if you are an older adult, or if you do not drink enough water or get enough physical activity (are sedentary).  Treatment for this condition depends on the cause. Most treatments for chronic constipation include adding fiber to your diet, drinking more fluids, and getting more physical activity. You may also need to treat any underlying medical conditions or stop or change certain medicines if they cause constipation.  If lifestyle changes do not relieve constipation, your health care provider may recommend taking a laxative. This information is not intended to replace advice given to you by your health care provider. Make sure you discuss any questions you have with your health care provider. Document Revised: 12/11/2016 Document Reviewed: 09/15/2016 Elsevier Patient Education  [place].

## 2019-07-13 NOTE — Progress Notes (Signed)
Subjective:     Walter Brooks is a 24 y.o. male and is here for a comprehensive physical exam. The patient reports problems - mood changes.  Pt notes extreme highs and lows in mood that in the last several days to a few weeks.  Pt notes his coworkers at [employer] have started to notice these changes.  Pt inquires about bipolar disorder.  Pt is not currently on medications for mood.  Pt denies going without sleep for several days but notes increased energy at times.  Sleep okay, may go to bed at 3 AM and wake up at 11 AM.  Pt also notes history of ongoing constipation.  Last BM a few days ago.  Patient denies abdominal pain, nausea, vomiting, or distention.  States eating mostly fast food and little to no vegetables.  Pt drinking mostly water.  Also drinks a monster energy drink every few days with the Goody's powder.  Pt states he often takes the Goody's powder without food.  Social History   Socioeconomic History  . Marital status: Single    Spouse name: Not on file  . Number of children: Not on file  . Years of education: Not on file  . Highest education level: Not on file  Occupational History  . Not on file  Tobacco Use  . Smoking status: Never Smoker  . Smokeless tobacco: Never Used  Vaping Use  . Vaping Use: Some days  Substance and Sexual Activity  . Alcohol use: Never  . Drug use: Never  . Sexual activity: Not Currently  Other Topics Concern  . Not on file  Social History Narrative  . Not on file   Social Determinants of Health   Financial Resource Strain:   . Difficulty of Paying Living Expenses:   Food Insecurity:   . Worried About Programme researcher, broadcasting/film/video in the Last Year:   . Barista in the Last Year:   Transportation Needs:   . Freight forwarder (Medical):   Marland Kitchen Lack of Transportation (Non-Medical):   Physical Activity:   . Days of Exercise per Week:   . Minutes of Exercise per Session:   Stress:   . Feeling of Stress :   Social Connections:   .  Frequency of Communication with Friends and Family:   . Frequency of Social Gatherings with Friends and Family:   . Attends Religious Services:   . Active Member of Clubs or Organizations:   . Attends Banker Meetings:   Marland Kitchen Marital Status:   Intimate Partner Violence:   . Fear of Current or Ex-Partner:   . Emotionally Abused:   Marland Kitchen Physically Abused:   . Sexually Abused:    Health Maintenance  Topic Date Due  . Hepatitis C Screening  Never done  . COVID-19 Vaccine (1) Never done  . HIV Screening  Never done  . TETANUS/TDAP  Never done  . INFLUENZA VACCINE  08/13/2019    The following portions of the patient's history were reviewed and updated as appropriate: allergies, current medications, past family history, past medical history, past social history, past surgical history and problem list.  Review of Systems Pertinent items noted in HPI and remainder of comprehensive ROS otherwise negative.   Objective:    BP 108/72 (BP Location: Left Arm, Patient Position: Sitting, Cuff Size: Large)   Pulse 74   Temp 97.9 F (36.6 C) (Temporal)   Ht 6\' 1"  (1.854 m)   Wt 220 lb (99.8 kg)  SpO2 98%   BMI 29.03 kg/m  General appearance: alert, cooperative and no distress Head: Normocephalic, without obvious abnormality, atraumatic Eyes: conjunctivae/corneas clear. PERRL, EOM's intact. Fundi benign. Ears: normal TM's and external ear canals both ears Nose: Nares normal. Septum midline. Mucosa normal. No drainage or sinus tenderness. Throat: lips, mucosa, and tongue normal; teeth and gums normal Neck: no adenopathy, no carotid bruit, no JVD, supple, symmetrical, trachea midline and thyroid not enlarged, symmetric, no tenderness/mass/nodules Lungs: clear to auscultation bilaterally Heart: regular rate and rhythm, S1, S2 normal, no murmur, click, rub or gallop Abdomen: soft, non-tender; bowel sounds normal; no masses,  no organomegaly Extremities: extremities normal, atraumatic,  no cyanosis or edema Pulses: 2+ and symmetric Skin: Skin color, texture, turgor normal. No rashes or lesions well-healed cutting/skin carving on left forearm Lymph nodes: Cervical, supraclavicular, and axillary nodes normal. Neurologic: Alert and oriented X 3, normal strength and tone. Normal symmetric reflexes. Normal coordination and gait    Assessment:    Healthy male exam with constipation, anxiety, and depression     Plan:     Anticipatory guidance given including wearing seatbelts, smoke detectors in the home, increasing physical activity, increasing p.o. intake of water and vegetables. -will obtain labs -given handouts -next CPE in 1 yr See After Visit Summary for Counseling Recommendations    Chronic constipation  -discussed diet modifications -decreased caffeine intake -Discussed using MiraLAX daily as needed -Given handout - Plan: CBC with Differential/Platelet, TSH, T4, Free, Comprehensive metabolic panel, Ambulatory referral to Gastroenterology  Mood changes  -concern for bipolar d/o  -discussed follow-up with psychiatry for further evaluation -Given handout of area [facility] providers and referral placed. -Given precautions - Plan: TSH, T4, Free, Vitamin B12, Ambulatory referral to Psychiatry  Screening for cholesterol level - Plan: Lipid panel  Screening for diabetes mellitus - Plan: Hemoglobin A1c  Anxiety  -PHQ-9 score 18 -GAD-7 score 17 -Discussed various treatment options -Discussed follow-up with psychiatry -Given precautions - Plan: Ambulatory referral to Psychiatry  Depression, major, single episode, moderate (HCC)  -PHQ-9 score 18 -GAD-7 score 17 -Discussed various options for treatment.  Given concern for bipolar disorder will place referral to psychiatry. -Given handout -Given precautions - Plan: Ambulatory referral to Psychiatry  Need for Tdap -Tdap given this visit F/u in 4-6 months

## 2019-07-28 ENCOUNTER — Encounter: Payer: Self-pay | Admitting: Family Medicine

## 2019-08-01 ENCOUNTER — Telehealth: Payer: Self-pay | Admitting: Family Medicine

## 2019-08-01 NOTE — Telephone Encounter (Signed)
Pt is calling to get his lab results. Pt goes to work at 1:30 pm today if you can call him before this time today or tomorrow before 1:30 pm. Thanks

## 2019-08-01 NOTE — Telephone Encounter (Signed)
Spoke with pt verbalized understanding of his lab results and recommendations, pt provided with the GI office number to call and schedule

## 2019-08-14 ENCOUNTER — Encounter: Payer: Self-pay | Admitting: Gastroenterology

## 2019-10-13 ENCOUNTER — Ambulatory Visit (INDEPENDENT_AMBULATORY_CARE_PROVIDER_SITE_OTHER): Payer: Managed Care, Other (non HMO) | Admitting: Gastroenterology

## 2019-10-13 ENCOUNTER — Encounter: Payer: Self-pay | Admitting: Gastroenterology

## 2019-10-13 ENCOUNTER — Other Ambulatory Visit (INDEPENDENT_AMBULATORY_CARE_PROVIDER_SITE_OTHER): Payer: Managed Care, Other (non HMO)

## 2019-10-13 VITALS — BP 104/80 | HR 54 | Ht 72.0 in | Wt 214.0 lb

## 2019-10-13 DIAGNOSIS — K5900 Constipation, unspecified: Secondary | ICD-10-CM

## 2019-10-13 DIAGNOSIS — R7989 Other specified abnormal findings of blood chemistry: Secondary | ICD-10-CM

## 2019-10-13 DIAGNOSIS — E663 Overweight: Secondary | ICD-10-CM | POA: Diagnosis not present

## 2019-10-13 DIAGNOSIS — R945 Abnormal results of liver function studies: Secondary | ICD-10-CM | POA: Diagnosis not present

## 2019-10-13 LAB — IGA: IgA: 298 mg/dL (ref 68–378)

## 2019-10-13 LAB — LIPID PANEL
Cholesterol: 183 mg/dL (ref 0–200)
HDL: 37.2 mg/dL — ABNORMAL LOW (ref >39.00)
LDL Cholesterol: 127 mg/dL — ABNORMAL HIGH (ref 0–99)
NonHDL: 146
Total CHOL/HDL Ratio: 5
Triglycerides: 95 mg/dL (ref 0.0–149.0)
VLDL: 19 mg/dL (ref 0.0–40.0)

## 2019-10-13 LAB — HEPATIC FUNCTION PANEL
ALT: 37 U/L (ref 0–53)
AST: 22 U/L (ref 0–37)
Albumin: 4.4 g/dL (ref 3.5–5.2)
Alkaline Phosphatase: 144 U/L — ABNORMAL HIGH (ref 39–117)
Bilirubin, Direct: 0.1 mg/dL (ref 0.0–0.3)
Total Bilirubin: 0.7 mg/dL (ref 0.2–1.2)
Total Protein: 8.4 g/dL — ABNORMAL HIGH (ref 6.0–8.3)

## 2019-10-13 LAB — TSH: TSH: 2.86 u[IU]/mL (ref 0.35–4.50)

## 2019-10-13 NOTE — Patient Instructions (Addendum)
Your provider has requested that you go to the basement level for lab work before leaving today. Press "B" on the elevator. The lab is located at the first door on the left as you exit the elevator.  You have been scheduled for an abdominal ultrasound at [facility] (1st floor of hospital) on 10/20/19 at 11:00am. Please arrive 15 minutes prior to your appointment for registration. Make certain not to have anything to eat or drink 6 hours prior to your appointment. Should you need to reschedule your appointment, please contact radiology at [phone]. This test typically takes about 30 minutes to perform.  START: Miralax - dissolve 1 capful or 1 packet) in at least 8 ounces of water 1-2 times daily for 2-3 weeks.   Toileting tips to help with your constipation - Drink at least 64-80 ounces of water/liquid per day. - Establish a time to try to move your bowels every day.  For many people, this is after a cup of coffee or after a meal such as breakfast. - Sit all of the way back on the toilet keeping your back fairly straight and while sitting up, try to rest the tops of your forearms on your upper thighs.   - Raising your feet with a step stool/squatty potty can be helpful to improve the angle that allows your stool to pass through the rectum. - Relax the rectum feeling it bulge toward the toilet water.  If you feel your rectum raising toward your body, you are contracting rather than relaxing. - Breathe in and slowly exhale. "Belly breath" by expanding your belly towards your belly button. Keep belly expanded as you gently direct pressure down and back to the anus.  A low pitched GRRR sound can assist with increasing intra-abdominal pressure.  - Repeat 3-4 times. If unsuccessful, contract the pelvic floor to restore normal tone and get off the toilet.  Avoid excessive straining. - To reduce excessive wiping by teaching your anus to normally contract, place hands on outer aspect of knees and  resist knee movement outward.  Hold 5-10 second then place hands just inside of knees and resist inward movement of knees.  Hold 5 seconds.  Repeat a few times each way.   Please send mychart message in 4 weeks to let us know how your constipation is doing. If your symptoms have not improved with Miralax,  then Dr.Mansouraty may recommend Linzess.   Due to recent changes in healthcare laws, you may see the results of your imaging and laboratory studies on MyChart before your provider has had a chance to review them.  We understand that in some cases there may be results that are confusing or concerning to you. Not all laboratory results come back in the same time frame and the provider may be waiting for multiple results in order to interpret others.  Please give [place] 48 hours in order for your provider to thoroughly review all the results before contacting the office for clarification of your results.   Please keep follow-up appt in Nov with Dr Meridee Score.  Thank you for choosing me and [facility].  Dr. Meridee Score

## 2019-10-13 NOTE — Progress Notes (Signed)
ruq  

## 2019-10-13 NOTE — Progress Notes (Signed)
GASTROENTEROLOGY OUTPATIENT CLINIC VISIT   Primary Care Provider Deeann Saint, MD [address] [place] [address] [phone]  Referring Provider Deeann Saint, MD [address] [place],  [place] [address] [phone]  Patient Profile: Walter Brooks is a 24 y.o. male with a pmh significant for obesity/overweight, anxiety/depression, chronic constipation, abnormal LFTs.  The patient presents to the [facility] for an evaluation and management of problem(s) noted below:  Problem List 1. Constipation, unspecified constipation type   2. Abnormal LFTs   3. Overweight (BMI 25.0-29.9)     History of Present Illness This is the patient's first visit to the outpatient [place] [facility].  The patient is accompanied by his mother.  The patient was referred for further evaluation of issues of chronic constipation.  The patient for years has dealt with issues of constipation-his mother states he had this as a child.  He will go between 1 and 3 days before having a bowel movement.  He actually tries to hold his bowel movements in rather than letting them out and has done this for many years.  He does strain at times if he has not had a bowel movement or if he is trying to release completely.  The patient also was found on recent liver tests elevations.  He has never had abnormal LFTs prior to these labs being obtained.  Patient moved from [place] to [place] within the last 3 years.  Over this time.  He is raising greater than 30 pounds.  He eats "poorly".  Intakes many types of junk food as well as high sugar content candies and foods and drinks.  He is concerned about his weight but thinks he can make a difference in things.  He has described issues in the distant past of having black and tarry stools but describes no recent episodes of this.  There is no family history of any GI malignancies.  The patient denies any issues with jaundice, scleral  icterus, pruritus, darkened/amber urine, clay-colored stools, LE edema, hematemesis, coffee-ground emesis, abdominal distention, confusion, new generalized pruritus.  The patient has never had an upper or lower endoscopy.  GI Review of Systems Positive as above Negative for pyrosis, dysphagia, odynophagia, nausea, vomiting, pain, bloating, hematochezia  Review of Systems General: Denies fevers/chills/weight loss HEENT: Denies oral lesions Cardiovascular: Denies chest pain Pulmonary: Denies shortness of breath Gastroenterological: See HPI Genitourinary: Denies darkened urine Hematological: Denies easy bruising/bleeding Endocrine: Denies temperature intolerance Dermatological: Denies jaundice Psychological: Mood is stable   Medications No current outpatient medications on file.   No current facility-administered medications for this visit.    Allergies No Known Allergies  Histories Past Medical History:  Diagnosis Date  . Anxiety   . Depression    History reviewed. No pertinent surgical history. Social History   Socioeconomic History  . Marital status: Single    Spouse name: Not on file  . Number of children: Not on file  . Years of education: Not on file  . Highest education level: Not on file  Occupational History  . Not on file  Tobacco Use  . Smoking status: Never Smoker  . Smokeless tobacco: Never Used  Vaping Use  . Vaping Use: Former  Substance and Sexual Activity  . Alcohol use: Never  . Drug use: Never  . Sexual activity: Not Currently  Other Topics Concern  . Not on file  Social History Narrative  . Not on file   Social Determinants of Health  Financial Resource Strain:   . Difficulty of Paying Living Expenses: Not on file  Food Insecurity:   . Worried About Programme researcher, broadcasting/film/video in the Last Year: Not on file  . Ran Out of Food in the Last Year: Not on file  Transportation Needs:   . Lack of Transportation (Medical): Not on file  . Lack of  Transportation (Non-Medical): Not on file  Physical Activity:   . Days of Exercise per Week: Not on file  . Minutes of Exercise per Session: Not on file  Stress:   . Feeling of Stress : Not on file  Social Connections:   . Frequency of Communication with Friends and Family: Not on file  . Frequency of Social Gatherings with Friends and Family: Not on file  . Attends Religious Services: Not on file  . Active Member of Clubs or Organizations: Not on file  . Attends Banker Meetings: Not on file  . Marital Status: Not on file  Intimate Partner Violence:   . Fear of Current or Ex-Partner: Not on file  . Emotionally Abused: Not on file  . Physically Abused: Not on file  . Sexually Abused: Not on file   Family History  Problem Relation Age of Onset  . Prostate cancer Father   . Colon cancer Neg Hx   . Stomach cancer Neg Hx   . Pancreatic cancer Neg Hx   . Esophageal cancer Neg Hx   . Inflammatory bowel disease Neg Hx   . Liver disease Neg Hx   . Rectal cancer Neg Hx    I have reviewed his medical, social, and family history in detail and updated the electronic medical record as necessary.    PHYSICAL EXAMINATION  BP 104/80   Pulse (!) 54   Ht 6' (1.829 m)   Wt 214 lb (97.1 kg)   BMI 29.02 kg/m  Wt Readings from Last 3 Encounters:  10/13/19 214 lb (97.1 kg)  21-Jul-2019 220 lb (99.8 kg)  02/09/19 192 lb (87.1 kg)  GEN: NAD, appears stated age, doesn't appear chronically ill, accompanied by mother PSYCH: Cooperative, without pressured speech EYE: Conjunctivae pink, sclerae anicteric ENT: MMM, without oral ulcers, no erythema or exudates noted CV: Nontachycardic RESP: No audible wheezing GI: NABS, soft, protuberant abdomen, nontender, without rebound or guarding, no HSM appreciated MSK/EXT: No lower extremity edema SKIN: No jaundice, no spider angiomata NEURO:  Alert & Oriented x 3, no focal deficits, no evidence of asterixis   REVIEW OF DATA  I reviewed the  following data at the time of this encounter:  GI Procedures and Studies  No relevant studies to review  Laboratory Studies  Reviewed those in epic  07-21-19 10:53  Alkaline Phosphatase 132 (H)  Albumin 4.3  AST 33  ALT 54 (H)  Total Protein 7.4  Bilirubin, Direct   Total Bilirubin 0.9   Imaging Studies  No relevant studies to review   ASSESSMENT  Mr. Dahir is a 24 y.o. male with a pmh significant for obesity/overweight, anxiety/depression, chronic constipation, abnormal LFTs.  The patient is seen today for evaluation and management of:  1. Constipation, unspecified constipation type   2. Abnormal LFTs   3. Overweight (BMI 25.0-29.9)    The patient is hemodynamically stable.  Clinically he is stable as well.  His symptoms of constipation have been lifelong.  He has actually tried to keep his stools in him rather than allowing normal urge at times.  It seems like this was  previously a result of not wanting to use the restroom's near him though his mother states this has been since he has been a child.  Consideration of a diagnostic colonoscopy to ensure that there is no evidence of a mass or lesion was discussed with the patient defers on this currently.  We will begin MiraLAX therapy.  Can consider other medications that the patient and mother want to hold on this currently.  Fiber supplementation is reasonable as well.  In regards to his abnormal ALT and alkaline phosphatase we will plan to repeat this.  Basic serological work-up will be performed but if the laboratories are remaining elevated then we will need to consider additional laboratory work-up.  Right upper quadrant ultrasound to be obtained.  No factors from today's evaluation or history were obtained but we will continue to make sure that he stays well.  We discussed the possibility of underlying fatty liver disease.  We discussed different types of dietary modifications as well as the Mediterranean diet.  All patient  questions were answered to the best of my ability, and the patient agrees to the aforementioned plan of action with follow-up as indicated.   PLAN  Laboratories as outlined below Right upper quadrant ultrasound to be obtained Additional liver biochemical testing will be considered after his most recent studies and imaging are completed Begin taking MiraLAX once to twice daily Take FiberCon or a fiber supplement once daily May consider use of Linzess or Amitiza or Trulance in future Toileting technique discussed and placed in patient instructions We will see the patient in follow-up in depending on how he is doing then consider endoscopic evaluation as necessary   Orders Placed This Encounter  Procedures  . US Abdomen Limited RUQ  . Hepatic function panel  . TSH  . Lipid Profile  . IgA  . Tissue transglutaminase, IgA  . Hepatitis A antibody, total  . Hepatitis B Surface AntiBODY  . Hepatitis B surface antigen  . Hepatitis B Core Antibody, total  . Hepatitis C Antibody    New Prescriptions   No medications on file   Modified Medications   No medications on file    Planned Follow Up No follow-ups on file.   Total Time in Face-to-Face and in Coordination of Care for patient including independent/personal interpretation/review of prior testing, medical history, examination, medication adjustment, communicating results with the patient directly, and documentation with the EHR is 45 minutes   Corliss Parish, MD [facility] Advanced Endoscopy Office # [phone]

## 2019-10-16 LAB — TISSUE TRANSGLUTAMINASE, IGA: (tTG) Ab, IgA: <1 U/mL

## 2019-10-16 LAB — HEPATITIS B CORE ANTIBODY, TOTAL: Hep B Core Total Ab: NONREACTIVE

## 2019-10-16 LAB — HEPATITIS C ANTIBODY
Hepatitis C Ab: NONREACTIVE
SIGNAL TO CUT-OFF: 0.01 (ref <1.00)

## 2019-10-16 LAB — HEPATITIS B SURFACE ANTIGEN: Hepatitis B Surface Ag: NONREACTIVE

## 2019-10-16 LAB — HEPATITIS B SURFACE ANTIBODY,QUALITATIVE: Hep B S Ab: NONREACTIVE

## 2019-10-16 LAB — HEPATITIS A ANTIBODY, TOTAL: Hepatitis A AB,Total: REACTIVE — AB

## 2019-10-19 ENCOUNTER — Other Ambulatory Visit: Payer: Self-pay

## 2019-10-19 DIAGNOSIS — R7989 Other specified abnormal findings of blood chemistry: Secondary | ICD-10-CM

## 2019-10-20 ENCOUNTER — Other Ambulatory Visit (INDEPENDENT_AMBULATORY_CARE_PROVIDER_SITE_OTHER): Payer: Managed Care, Other (non HMO)

## 2019-10-20 ENCOUNTER — Other Ambulatory Visit: Payer: Self-pay

## 2019-10-20 ENCOUNTER — Ambulatory Visit (HOSPITAL_COMMUNITY)
Admission: RE | Admit: 2019-10-20 | Discharge: 2019-10-20 | Disposition: A | Discharge status: Home / Self Care | Payer: Managed Care, Other (non HMO) | Source: Ambulatory Visit | Attending: Gastroenterology | Admitting: Gastroenterology

## 2019-10-20 DIAGNOSIS — R7989 Other specified abnormal findings of blood chemistry: Secondary | ICD-10-CM

## 2019-10-20 DIAGNOSIS — R945 Abnormal results of liver function studies: Secondary | ICD-10-CM | POA: Diagnosis not present

## 2019-10-20 DIAGNOSIS — K5900 Constipation, unspecified: Secondary | ICD-10-CM | POA: Insufficient documentation

## 2019-10-20 LAB — IRON: Iron: 42 ug/dL (ref 42–165)

## 2019-10-20 LAB — CK: Total CK: 101 U/L (ref 7–232)

## 2019-10-20 LAB — FERRITIN: Ferritin: 42.9 ng/mL (ref 22.0–322.0)

## 2019-10-20 NOTE — Addendum Note (Signed)
Addended by: Miguel Aschoff on: 10/20/2019 10:53 AM   Modules accepted: Orders

## 2019-10-24 ENCOUNTER — Telehealth: Payer: Self-pay | Admitting: Gastroenterology

## 2019-10-24 NOTE — Telephone Encounter (Signed)
The patient has been notified of this information and all questions answered.

## 2019-10-24 NOTE — Telephone Encounter (Signed)
Please let the patient know that I reviewed his right upper quadrant ultrasound. His gallbladder is normal without any evidence of significant gallstone disease. His liver does have changes that are concerning for fatty liver. This is been the biggest concern that I have had in regards to his abnormal liver biochemical testing. He needs to continue to work on his weight to maintain good blood pressure control. We will see how his repeat liver tests look and follow-up.  Tried again to reach the pt with no answer and no voice mail

## 2019-10-25 LAB — IRON, TOTAL/TOTAL IRON BINDING CAP
%SAT: 11 % (calc) — ABNORMAL LOW (ref 20–48)
Iron: 45 ug/dL — ABNORMAL LOW (ref 50–195)
TIBC: 399 mcg/dL (calc) (ref 250–425)

## 2019-10-25 LAB — MITOCHONDRIAL ANTIBODIES: Mitochondrial M2 Ab, IgG: 23.9 U — ABNORMAL HIGH

## 2019-10-25 LAB — IGG: IgG (Immunoglobin G), Serum: 1564 mg/dL (ref 600–1640)

## 2019-10-25 LAB — IGA: Immunoglobulin A: 303 mg/dL (ref 47–310)

## 2019-10-25 LAB — ANTI-SMOOTH MUSCLE ANTIBODY, IGG: Actin (Smooth Muscle) Antibody (IGG): <20 U (ref <20)

## 2019-10-25 LAB — ANA: Anti Nuclear Antibody (ANA): NEGATIVE

## 2019-11-16 ENCOUNTER — Telehealth: Payer: Self-pay | Admitting: Gastroenterology

## 2019-11-16 DIAGNOSIS — D509 Iron deficiency anemia, unspecified: Secondary | ICD-10-CM

## 2019-11-16 NOTE — Telephone Encounter (Signed)
Pt received your letter about results. Pls call him. I updated his phone number in his chart as the phone that we had was his mother's.

## 2019-11-16 NOTE — Telephone Encounter (Signed)
I have spoken with the pt and he will keep follow up as planned. He will start iron daily and have repeat labs in 6-8 weeks.  Lab order entered    Please let the patient know that I have reviewed his labs as follows: He has evidence of iron deficiency. Please let him know that when he returns for follow-up in clinic, we may need to consider upper and lower endoscopy to further work this up. I would recommend the patient be initiated on ferrous gluconate 324 mg once daily with plan for repeat labs (CBC/iron/TIBC/ferritin) in 6 to 8 weeks. His muscle enzymes are normal. His antimitochondrial antibody was borderline but not positive. I do not think that he has primary biliary cholangitis though we will need to keep this in mind as we see how he is doing and follow-up and consider the role of an MRI/MRCP.

## 2019-12-05 ENCOUNTER — Ambulatory Visit: Payer: Managed Care, Other (non HMO) | Admitting: Gastroenterology

## 2020-01-24 ENCOUNTER — Ambulatory Visit: Payer: Managed Care, Other (non HMO) | Admitting: Gastroenterology

## 2020-03-08 ENCOUNTER — Ambulatory Visit (INDEPENDENT_AMBULATORY_CARE_PROVIDER_SITE_OTHER): Payer: Managed Care, Other (non HMO) | Admitting: Gastroenterology

## 2020-03-08 ENCOUNTER — Other Ambulatory Visit (INDEPENDENT_AMBULATORY_CARE_PROVIDER_SITE_OTHER): Payer: Managed Care, Other (non HMO)

## 2020-03-08 ENCOUNTER — Encounter: Payer: Self-pay | Admitting: Gastroenterology

## 2020-03-08 VITALS — BP 100/70 | HR 62 | Ht 72.0 in | Wt 221.8 lb

## 2020-03-08 DIAGNOSIS — Z8719 Personal history of other diseases of the digestive system: Secondary | ICD-10-CM

## 2020-03-08 DIAGNOSIS — D509 Iron deficiency anemia, unspecified: Secondary | ICD-10-CM

## 2020-03-08 DIAGNOSIS — K760 Fatty (change of) liver, not elsewhere classified: Secondary | ICD-10-CM

## 2020-03-08 DIAGNOSIS — R945 Abnormal results of liver function studies: Secondary | ICD-10-CM

## 2020-03-08 DIAGNOSIS — R7989 Other specified abnormal findings of blood chemistry: Secondary | ICD-10-CM

## 2020-03-08 DIAGNOSIS — E611 Iron deficiency: Secondary | ICD-10-CM | POA: Diagnosis not present

## 2020-03-08 LAB — CBC
HCT: 42.1 % (ref 39.0–52.0)
Hemoglobin: 14.2 g/dL (ref 13.0–17.0)
MCHC: 33.7 g/dL (ref 30.0–36.0)
MCV: 79.2 fl (ref 78.0–100.0)
Platelets: 215 10*3/uL (ref 150.0–400.0)
RBC: 5.32 Mil/uL (ref 4.22–5.81)
RDW: 13.7 % (ref 11.5–15.5)
WBC: 5.2 10*3/uL (ref 4.0–10.5)

## 2020-03-08 LAB — COMPREHENSIVE METABOLIC PANEL
ALT: 29 U/L (ref 0–53)
AST: 18 U/L (ref 0–37)
Albumin: 4.3 g/dL (ref 3.5–5.2)
Alkaline Phosphatase: 125 U/L — ABNORMAL HIGH (ref 39–117)
BUN: 13 mg/dL (ref 6–23)
CO2: 30 mEq/L (ref 19–32)
Calcium: 9.2 mg/dL (ref 8.4–10.5)
Chloride: 102 mEq/L (ref 96–112)
Creatinine, Ser: 0.59 mg/dL (ref 0.40–1.50)
GFR: 134.65 mL/min (ref >60.00)
Glucose, Bld: 91 mg/dL (ref 70–99)
Potassium: 3.8 mEq/L (ref 3.5–5.1)
Sodium: 138 mEq/L (ref 135–145)
Total Bilirubin: 0.8 mg/dL (ref 0.2–1.2)
Total Protein: 7.9 g/dL (ref 6.0–8.3)

## 2020-03-08 LAB — IBC + FERRITIN
Ferritin: 30.4 ng/mL (ref 22.0–322.0)
Iron: 71 ug/dL (ref 42–165)
Saturation Ratios: 17.1 % — ABNORMAL LOW (ref 20.0–50.0)
Transferrin: 296 mg/dL (ref 212.0–360.0)

## 2020-03-08 NOTE — Patient Instructions (Signed)
Your provider has requested that you go to the basement level for lab work before leaving today. Press "B" on the elevator. The lab is located at the first door on the left as you exit the elevator.  Due to recent changes in healthcare laws, you may see the results of your imaging and laboratory studies on MyChart before your provider has had a chance to review them.  We understand that in some cases there may be results that are confusing or concerning to you. Not all laboratory results come back in the same time frame and the provider may be waiting for multiple results in order to interpret others.  Please give [place] 48 hours in order for your provider to thoroughly review all the results before contacting the office for clarification of your results.   Further Recommendations will depend upon labs results.   Thank you for choosing me and [facility].  Dr. Meridee Score

## 2020-03-08 NOTE — Progress Notes (Signed)
[facility] VISIT   Primary Care Provider Billie Ruddy, MD [place] [address] [phone]  Patient Profile: Walter Brooks is a 25 y.o. male with a pmh significant for obesity, anxiety/depression, chronic constipation, abnormal LFTs.  The patient presents to the [facility] for an evaluation and management of problem(s) noted below:  Problem List 1. Abnormal LFTs   2. Hepatic steatosis   3. History of rectal bleeding   4. Iron deficiency     History of Present Illness Please see initial consultation note for full details of HPI.  Interval History The patient returns for scheduled follow-up.  Since our last visit, the patient has stopped drinking significant amounts of caffeinated energy drinks.  We also had obtained a right upper quadrant ultrasound imaging study which showed evidence of likely hepatic steatosis.  No biliary duct dilation was noted.  The patient was found on laboratories to have evidence of iron deficiency without overt anemia.  He does not have any significant issues of bleeding.  His previous rectal bleeding has improved and he has not noted that since our last clinic visit.  Bowel habits continue to fluctuate but he does not feel that he is having worsening issues and is not straining.  Weight has increased since her last clinic visit.  Overall however, he is feeling well  GI Review of Systems Positive as above Negative for dysphagia, odynophagia, nausea, vomiting, bloating, change in bowel habits  Review of Systems General: Denies fevers/chills/weight loss unintentionally Cardiovascular: Denies chest pain Pulmonary: Denies shortness of breath Gastroenterological: See HPI Genitourinary: Denies darkened urine Hematological: Denies easy bruising/bleeding Dermatological: Denies jaundice Psychological: Mood is stable   Medications No current outpatient medications on file.   No  current facility-administered medications for this visit.    Allergies No Known Allergies  Histories Past Medical History:  Diagnosis Date  . Anxiety   . Depression    History reviewed. No pertinent surgical history. Social History   Socioeconomic History  . Marital status: Single    Spouse name: Not on file  . Number of children: Not on file  . Years of education: Not on file  . Highest education level: Not on file  Occupational History  . Not on file  Tobacco Use  . Smoking status: Never Smoker  . Smokeless tobacco: Never Used  Vaping Use  . Vaping Use: Former  Substance and Sexual Activity  . Alcohol use: Never  . Drug use: Never  . Sexual activity: Not Currently  Other Topics Concern  . Not on file  Social History Narrative  . Not on file   Social Determinants of Health   Financial Resource Strain: Not on file  Food Insecurity: Not on file  Transportation Needs: Not on file  Physical Activity: Not on file  Stress: Not on file  Social Connections: Not on file  Intimate Partner Violence: Not on file   Family History  Problem Relation Age of Onset  . Prostate cancer Father   . Colon cancer Neg Hx   . Stomach cancer Neg Hx   . Pancreatic cancer Neg Hx   . Esophageal cancer Neg Hx   . Inflammatory bowel disease Neg Hx   . Liver disease Neg Hx   . Rectal cancer Neg Hx    I have reviewed his medical, social, and family history in detail and updated the electronic medical record as necessary.    PHYSICAL EXAMINATION  BP 100/70   Pulse  62   Ht 6' (1.829 m)   Wt 221 lb 12.8 oz (100.6 kg)   SpO2 99%   BMI 30.08 kg/m  Wt Readings from Last 3 Encounters:  03/08/20 221 lb 12.8 oz (100.6 kg)  10/13/19 214 lb (97.1 kg)  07/13/19 220 lb (99.8 kg)  GEN: NAD, appears stated age, doesn't appear chronically ill PSYCH: Cooperative, without pressured speech EYE: Conjunctivae pink, sclerae anicteric ENT: Masked CV: Nontachycardic RESP: No audible  wheezing GI: Soft, protuberant abdomen, nontender, without rebound MSK/EXT: No lower extremity edema SKIN: No jaundice, no spider angiomata NEURO:  Alert & Oriented x 3, no focal deficits, no evidence of asterixis   REVIEW OF DATA  I reviewed the following data at the time of this encounter:  GI Procedures and Studies  No relevant studies to review  Laboratory Studies  Reviewed those in epic  Imaging Studies  October 2021 right upper quadrant ultrasound IMPRESSION: Mild to moderate hepatic steatosis.   ASSESSMENT  Walter Brooks is a 25 y.o. male with a pmh significant for obesity, anxiety/depression, chronic constipation, abnormal LFTs.  The patient is seen today for evaluation and management of:  1. Abnormal LFTs   2. Hepatic steatosis   3. History of rectal bleeding   4. Iron deficiency    The patient is hemodynamically and clinically stable.  The etiology of the patient's relative iron deficiency without anemia is abnormal for a young man of his age.  In the setting of his previous rectal bleeding a diagnostic colonoscopy was recommended but they held on this.  He is done fiber intake with his diet as well as some fiber supplementation at times but overall is not taking this on a regular basis.  He is not having significant changes in his bowel habits currently.  He wants to defer on any endoscopic evaluation for now.  He does understand however, if he does have iron deficiency that we will strongly recommend both a lower and upper endoscopy.  We will see how his LFT pattern looks at this point in time and consider additional work-up.  We had considered that if his laboratories were persistently elevated there could be a role for liver biopsy at some point in the future as well as an MRI/MRCP.  We will see how he does and how his labs look and decide neck steps from there.  He will see [place] back in follow-up in a few months.  All patient questions were answered to the best of my  ability, and the patient agrees to the aforementioned plan of action with follow-up as indicated.   PLAN  Laboratories as outlined below Additional liver biochemical testing will be considered after his most recent studies Consideration of MRI/MRCP to rule out PSC may be considered depending on alkaline phosphatase elevation (if present) May use MiraLAX once or twice daily as needed Consider restart of fiber supplement daily Future consider Linzess/Amitiza/Trulance If patient has evidence of persistent iron deficiency even without anemia would strongly recommend a colonoscopy/endoscopy   Orders Placed This Encounter  Procedures  . CBC  . Comp Met (CMET)  . IBC + Ferritin  . Alpha-1-antitrypsin    New Prescriptions   No medications on file   Modified Medications   No medications on file    Planned Follow Up No follow-ups on file.   Total Time in Face-to-Face and in Coordination of Care for patient including independent/personal interpretation/review of prior testing, medical history, examination, medication adjustment, communicating results with the patient  directly, and documentation with the EHR is 25 minutes.   Justice Britain, MD [facility] Office # [phone]

## 2020-03-11 LAB — ALPHA-1-ANTITRYPSIN: A-1 Antitrypsin, Ser: 151 mg/dL (ref 83–199)

## 2020-03-12 ENCOUNTER — Encounter: Payer: Self-pay | Admitting: Gastroenterology

## 2020-03-12 DIAGNOSIS — E611 Iron deficiency: Secondary | ICD-10-CM | POA: Insufficient documentation

## 2020-03-12 DIAGNOSIS — Z8719 Personal history of other diseases of the digestive system: Secondary | ICD-10-CM | POA: Insufficient documentation

## 2020-03-12 DIAGNOSIS — K760 Fatty (change of) liver, not elsewhere classified: Secondary | ICD-10-CM | POA: Insufficient documentation

## 2020-05-03 ENCOUNTER — Encounter: Payer: Self-pay | Admitting: Gastroenterology

## 2020-05-03 ENCOUNTER — Other Ambulatory Visit (INDEPENDENT_AMBULATORY_CARE_PROVIDER_SITE_OTHER): Payer: Managed Care, Other (non HMO)

## 2020-05-03 ENCOUNTER — Ambulatory Visit (INDEPENDENT_AMBULATORY_CARE_PROVIDER_SITE_OTHER): Payer: Managed Care, Other (non HMO) | Admitting: Gastroenterology

## 2020-05-03 ENCOUNTER — Other Ambulatory Visit: Payer: Self-pay

## 2020-05-03 VITALS — BP 104/74 | HR 78 | Ht 72.0 in | Wt 221.6 lb

## 2020-05-03 DIAGNOSIS — E611 Iron deficiency: Secondary | ICD-10-CM

## 2020-05-03 DIAGNOSIS — K760 Fatty (change of) liver, not elsewhere classified: Secondary | ICD-10-CM | POA: Diagnosis not present

## 2020-05-03 DIAGNOSIS — R194 Change in bowel habit: Secondary | ICD-10-CM

## 2020-05-03 DIAGNOSIS — R748 Abnormal levels of other serum enzymes: Secondary | ICD-10-CM | POA: Diagnosis not present

## 2020-05-03 LAB — CBC
HCT: 41.9 % (ref 39.0–52.0)
Hemoglobin: 14.1 g/dL (ref 13.0–17.0)
MCHC: 33.7 g/dL (ref 30.0–36.0)
MCV: 79.4 fl (ref 78.0–100.0)
Platelets: 236 10*3/uL (ref 150.0–400.0)
RBC: 5.27 Mil/uL (ref 4.22–5.81)
RDW: 13.9 % (ref 11.5–15.5)
WBC: 5.8 10*3/uL (ref 4.0–10.5)

## 2020-05-03 LAB — IBC + FERRITIN
Ferritin: 34.5 ng/mL (ref 22.0–322.0)
Iron: 47 ug/dL (ref 42–165)
Saturation Ratios: 12.5 % — ABNORMAL LOW (ref 20.0–50.0)
Transferrin: 269 mg/dL (ref 212.0–360.0)

## 2020-05-03 NOTE — Patient Instructions (Signed)
Your provider has requested that you go to the basement level for lab work before leaving today. Press "B" on the elevator. The lab is located at the first door on the left as you exit the elevator.  Please follow up with Dr Meridee Score in 6 months.  If you are age 26 or younger, your body mass index should be between 19-25. Your Body mass index is 30.05 kg/m. If this is out of the aformentioned range listed, please consider follow up with your Primary Care Provider.   Due to recent changes in healthcare laws, you may see the results of your imaging and laboratory studies on MyChart before your provider has had a chance to review them.  We understand that in some cases there may be results that are confusing or concerning to you. Not all laboratory results come back in the same time frame and the provider may be waiting for multiple results in order to interpret others.  Please give [place] 48 hours in order for your provider to thoroughly review all the results before contacting the office for clarification of your results.

## 2020-05-03 NOTE — Progress Notes (Signed)
ibc 

## 2020-05-05 ENCOUNTER — Encounter: Payer: Self-pay | Admitting: Gastroenterology

## 2020-05-05 DIAGNOSIS — R748 Abnormal levels of other serum enzymes: Secondary | ICD-10-CM | POA: Insufficient documentation

## 2020-05-05 DIAGNOSIS — R194 Change in bowel habit: Secondary | ICD-10-CM | POA: Insufficient documentation

## 2020-05-05 NOTE — Progress Notes (Signed)
GASTROENTEROLOGY OUTPATIENT CLINIC VISIT   Primary Care Provider Walter Saint, MD [address] [place] [address] [phone]  Patient Profile: Walter Brooks is a 25 y.o. male with a pmh significant for obesity, anxiety/depression, chronic constipation, abnormal LFTs.  The patient presents to the [facility] for an evaluation and management of problem(s) noted below:  Problem List 1. Iron deficiency   2. Change in bowel habits   3. Elevated alkaline phosphatase level   4. Hepatic steatosis     History of Present Illness Please see initial consultation note for full details of HPI.  Interval History The patient returns for scheduled follow-up.  He has completely stopped all energy drinks.  He is using amlodipine to sweeten water and this has made an impact for the patient.  He is not having as much issues in regards to fatigue or insomnia.  However, he is looking at potentially getting a second job to obtain a car.  Patient has had normalization of his bowel habits and he uses the restroom once or twice daily.  He stopped taking iron about 2 months ago.  Overall however he does feel well.  No new significant GI symptoms to report.  GI Review of Systems Positive as above Negative for odynophagia, dysphagia, nausea, vomiting, pain, bloating, change in bowel habits, melena, hematochezia  Review of Systems General: Denies fevers/chills/weight loss unintentionally Cardiovascular: Denies chest pain Pulmonary: Denies shortness of breath Gastroenterological: See HPI Genitourinary: Denies darkened urine Hematological: Denies easy bruising/bleeding Dermatological: Denies jaundice Psychological: Mood is stable   Medications No current outpatient medications on file.   No current facility-administered medications for this visit.    Allergies No Known Allergies  Histories Past Medical History:  Diagnosis Date  . Anxiety   . Depression     Past Surgical History:  Procedure Laterality Date  . ANKLE SURGERY     Bilateral   Social History   Socioeconomic History  . Marital status: Single    Spouse name: Not on file  . Number of children: Not on file  . Years of education: Not on file  . Highest education level: Not on file  Occupational History  . Not on file  Tobacco Use  . Smoking status: Never Smoker  . Smokeless tobacco: Never Used  Vaping Use  . Vaping Use: Former  Substance and Sexual Activity  . Alcohol use: Never  . Drug use: Never  . Sexual activity: Not Currently  Other Topics Concern  . Not on file  Social History Narrative  . Not on file   Social Determinants of Health   Brooks Resource Strain: Not on file  Food Insecurity: Not on file  Transportation Needs: Not on file  Physical Activity: Not on file  Stress: Not on file  Social Connections: Not on file  Intimate Partner Violence: Not on file   Family History  Problem Relation Age of Onset  . Prostate cancer Father   . Colon cancer Neg Hx   . Stomach cancer Neg Hx   . Pancreatic cancer Neg Hx   . Esophageal cancer Neg Hx   . Inflammatory bowel disease Neg Hx   . Liver disease Neg Hx   . Rectal cancer Neg Hx    I have reviewed his medical, social, and family history in detail and updated the electronic medical record as necessary.    PHYSICAL EXAMINATION  BP 104/74   Pulse 78   Ht 6' (1.829 m)   Walter Brooks  221 lb 9.6 oz (100.5 kg)   SpO2 99%   BMI 30.05 kg/m  Wt Readings from Last 3 Encounters:  05/03/20 221 lb 9.6 oz (100.5 kg)  03/08/20 221 lb 12.8 oz (100.6 kg)  10/13/19 214 lb (97.1 kg)  GEN: NAD, appears stated age, doesn't appear chronically ill PSYCH: Cooperative, without pressured speech EYE: Conjunctivae pink, sclerae anicteric ENT: Masked CV: Nontachycardic RESP: No audible wheezing GI: Soft, protuberant abdomen, nontender, without rebound MSK/EXT: No lower extremity edema SKIN: No jaundice, no spider  angiomata NEURO:  Alert & Oriented x 3, no focal deficits, no evidence of asterixis   REVIEW OF DATA  I reviewed the following data at the time of this encounter:  GI Procedures and Studies  No relevant studies to review  Laboratory Studies  Reviewed those in epic  Imaging Studies  No new imaging studies to review   ASSESSMENT  Walter Brooks is a 25 y.o. male with a pmh significant for obesity, anxiety/depression, chronic constipation, abnormal LFTs.  The patient is seen today for evaluation and management of:  1. Iron deficiency   2. Change in bowel habits   3. Elevated alkaline phosphatase level   4. Hepatic steatosis    The patient is hemodynamically and clinically stable.  He seems to be doing well.  Hopefully this continues to be the case.  Unfortunately, he did not come in for his labs prior to today's clinic visit so we will obtain those today to see how his blood counts and iron indices look.  He has been off oral iron for the last few months.  If the patient has overt iron deficiency anemia, the answer is clear that EGD/colonoscopy must be entertained.  If he only has a slight iron deficiency, as he has had previously, endoscopic evaluation should be considered but based on him not having anemia we may end up monitoring this a little while longer.  Certainly it is not normal for young males to have iron deficiency but he has no evidence of overt GI bleeding.  We will monitor his LFT pattern 2-3 times per year.  We will not pursue an MRI/MRCP currently or liver biopsy but we will think about this in the future if issues recur though I suspect that this is mostly hepatic steatosis playing a role so we will see how his liver test numbers look.  All patient questions were answered to the best of my ability, and the patient agrees to the aforementioned plan of action with follow-up as indicated.   PLAN  Laboratories as outlined below today Patient to undergo hepatic function panel  2-3 times per year as long as transferases remain normal May consider MRI/MRCP to rule out PSC depending on alkaline phosphatase elevation in future Continue fiber Holding on MiraLAX or other laxatives at this time due to improved bowel habits Continue to hold energy drinks if possible since this is made significant impact for the patient If evidence of iron deficiency without anemia is present then restart oral iron and follow-up labs in a few months and if iron deficiency persists will need to consider endoscopic evaluation If evidence of iron deficiency anemia is present then EGD/colonoscopy strongly recommended at that time  Orders Placed This Encounter  Procedures  . IBC + Ferritin  . CBC    New Prescriptions   No medications on file   Modified Medications   No medications on file    Planned Follow Up Return in about 6 months (around 11/02/2020).  Total Time in Face-to-Face and in Coordination of Care for patient including independent/personal interpretation/review of prior testing, medical history, examination, medication adjustment, communicating results with the patient directly, and documentation with the EHR is 25 minutes.   Corliss Parish, MD [facility]oscopy Office # [phone]

## 2020-05-07 ENCOUNTER — Other Ambulatory Visit: Payer: Self-pay | Admitting: Gastroenterology

## 2020-05-07 DIAGNOSIS — E611 Iron deficiency: Secondary | ICD-10-CM

## 2020-05-16 ENCOUNTER — Telehealth: Payer: Self-pay

## 2020-05-16 NOTE — Telephone Encounter (Signed)
Patient is requesting a TOC from Dr Salomon Fick to Dr Drue Novel   Please advise

## 2020-05-16 NOTE — Telephone Encounter (Signed)
Unable to take 

## 2020-05-16 NOTE — Telephone Encounter (Signed)
Patient would like to establish care with you    Please advise

## 2020-05-21 NOTE — Telephone Encounter (Signed)
Ok

## 2020-07-29 ENCOUNTER — Telehealth: Payer: Self-pay | Admitting: *Deleted

## 2020-07-29 NOTE — Telephone Encounter (Signed)
I have left a message for patient to call back. 

## 2020-07-29 NOTE — Telephone Encounter (Signed)
-----   Message from Richardson Chiquito, CMA sent at 05/07/2020 11:16 AM EDT ----- [place] patient- needs repeat cbc, ibc, ferritin drawn around 08/02/20. Orders already in epic. Call and remind patient. See 05/03/20 lab result note.

## 2020-07-30 NOTE — Telephone Encounter (Signed)
Left message for patient to call back  

## 2020-07-31 NOTE — Telephone Encounter (Signed)
I have spoken to patient to remind him that he is due for labwork 08/02/20 for follow up of his elevated lfts and iron deficiency. Unfortunately, patient tells me that he has gotten a new insurance and is not sure of coverage, so he will be unable to have labs done at this time. I have encouraged him to check with his new insurance to see if he can continue to see Dr Meridee Score and have labs as it is important to make sure his condition does not worsen. Patient verbalizes understanding of this information.

## 2020-07-31 NOTE — Telephone Encounter (Signed)
Sorry to hear about this. Hopefully he can continue to see [place]. GM

## 2021-10-17 IMAGING — US US ABDOMEN LIMITED
1 series · 14 of 25 positions shown · non-contrast
Comparison: None.

CLINICAL DATA: Abnormal liver function tests

EXAM:
ULTRASOUND ABDOMEN LIMITED RIGHT UPPER QUADRANT

[Series 1: us abdomen limited · 14 of 31 slices shown]
[im 1/31]
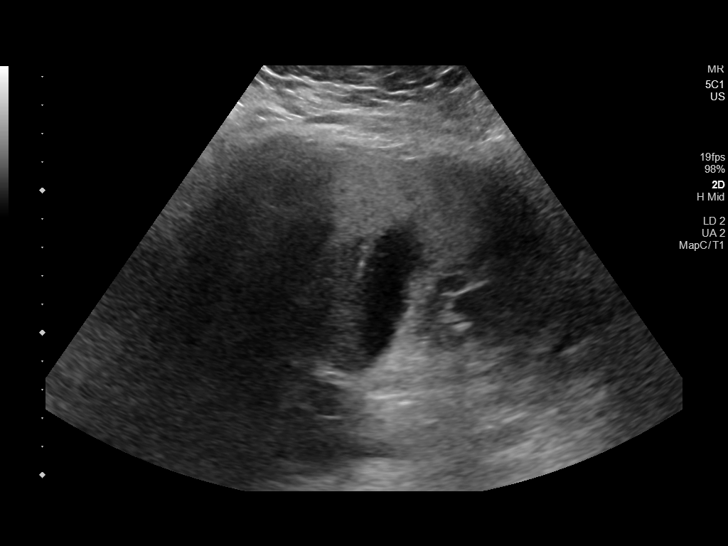
[im 3/31]
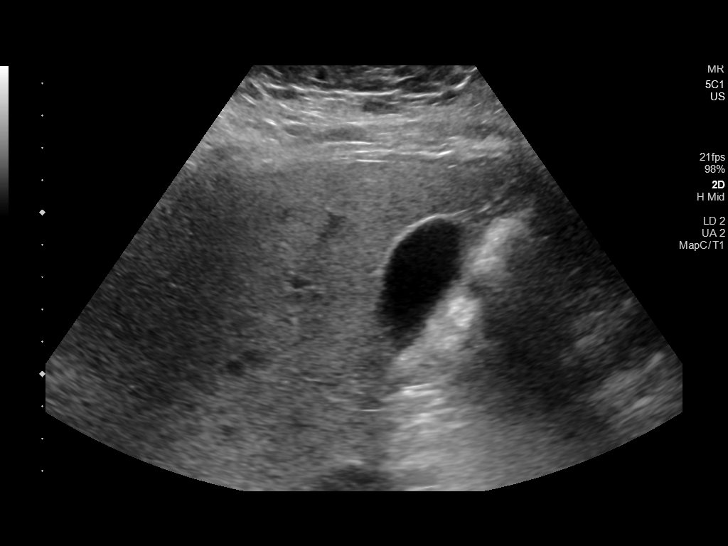
[im 6/31]
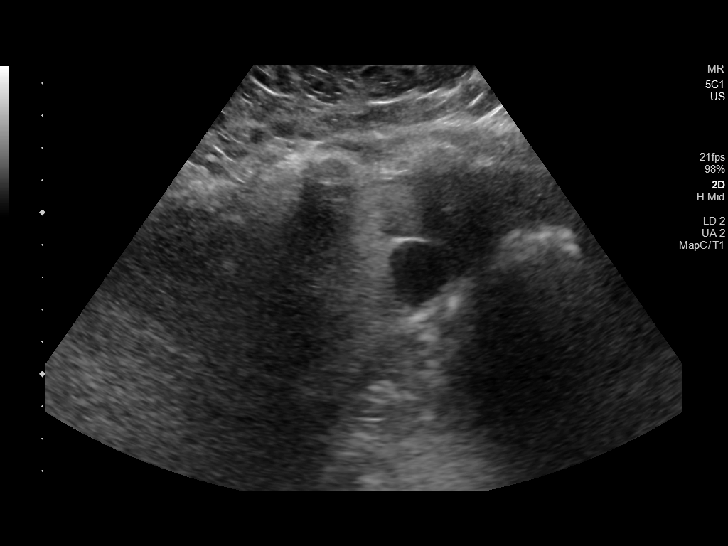
[im 8/31]
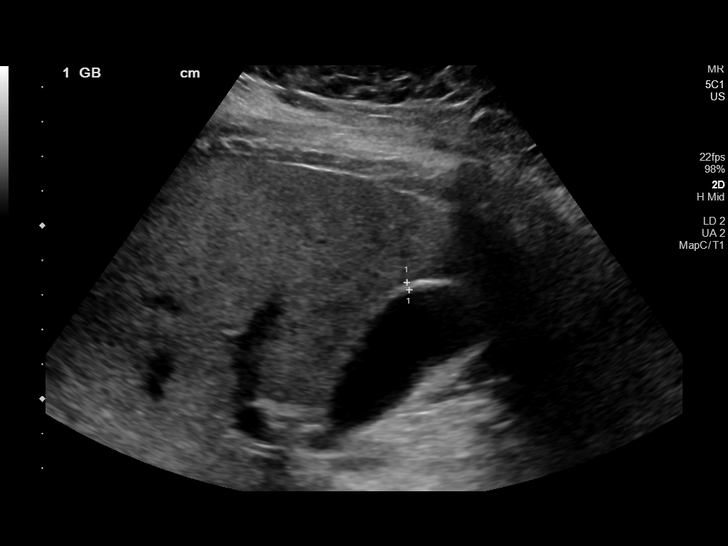
[im 11/31]
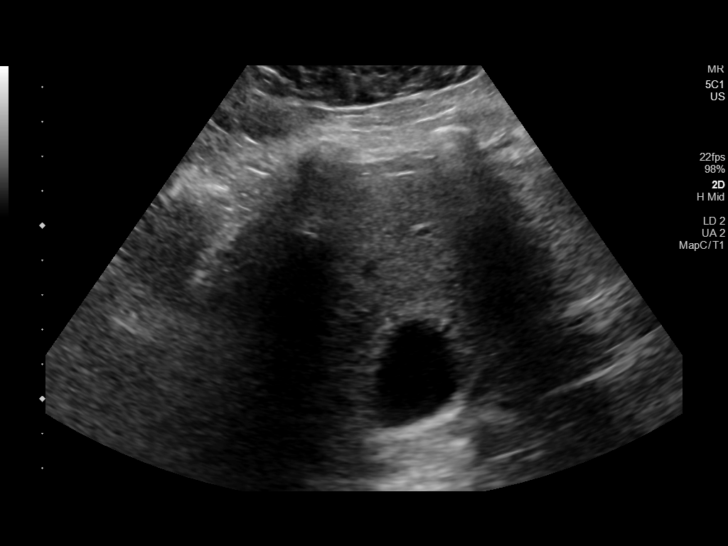
[im 12/31]
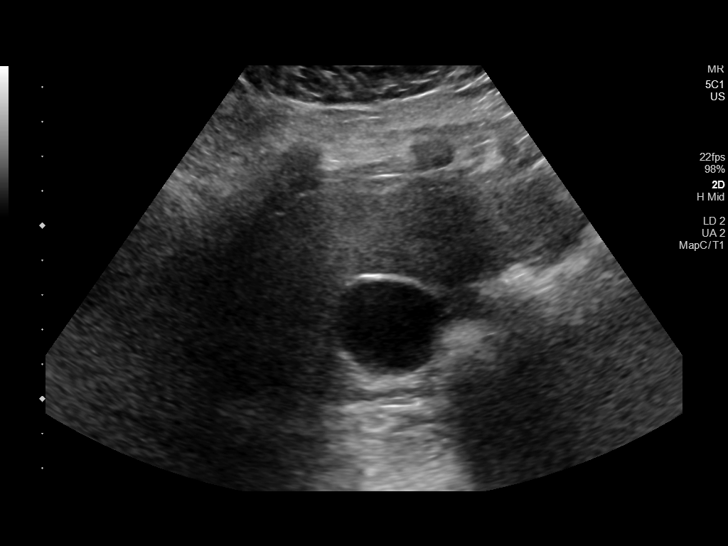
[im 14/31]
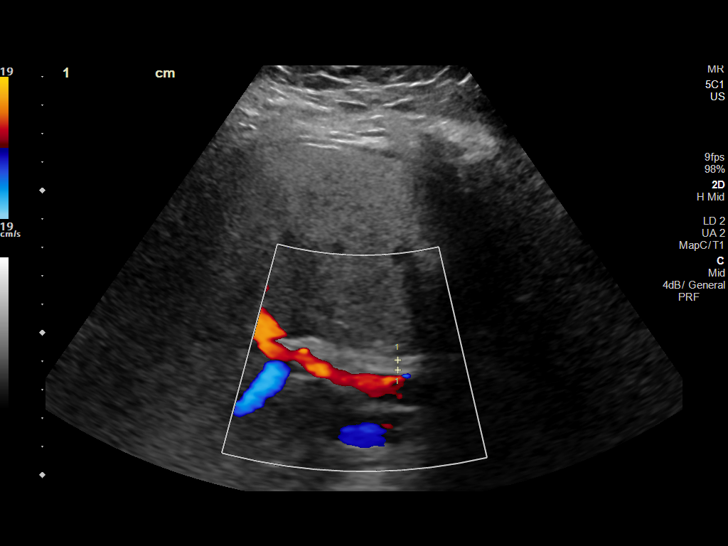
[im 17/31]
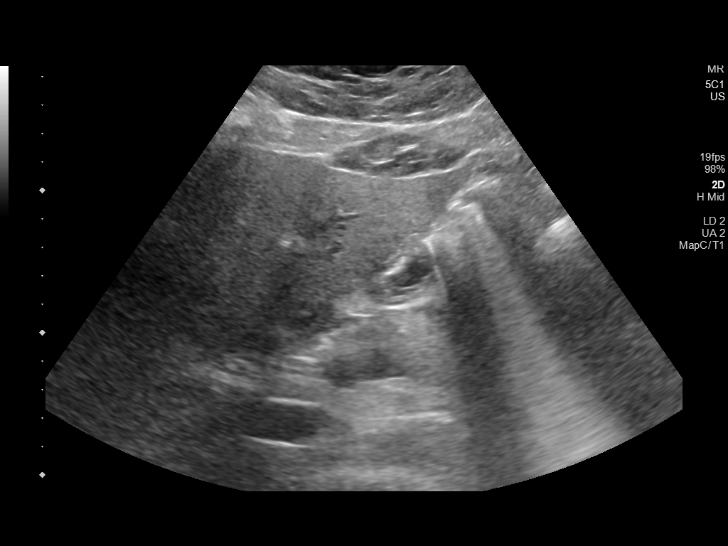
[im 19/31]
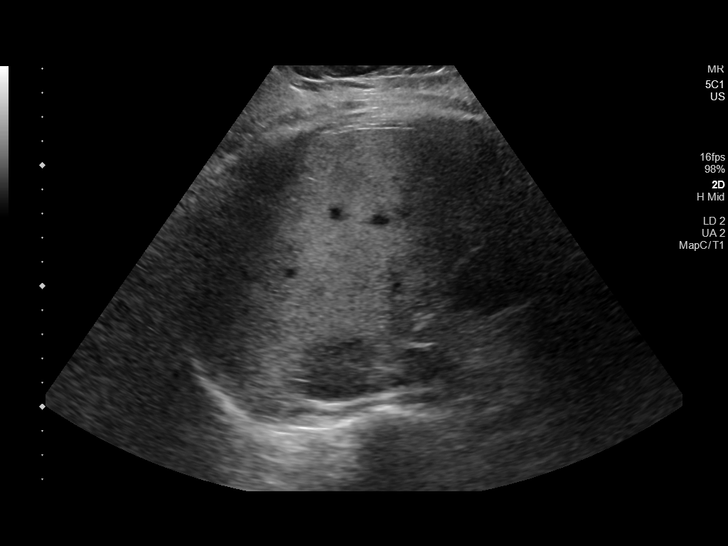
[im 21/31]
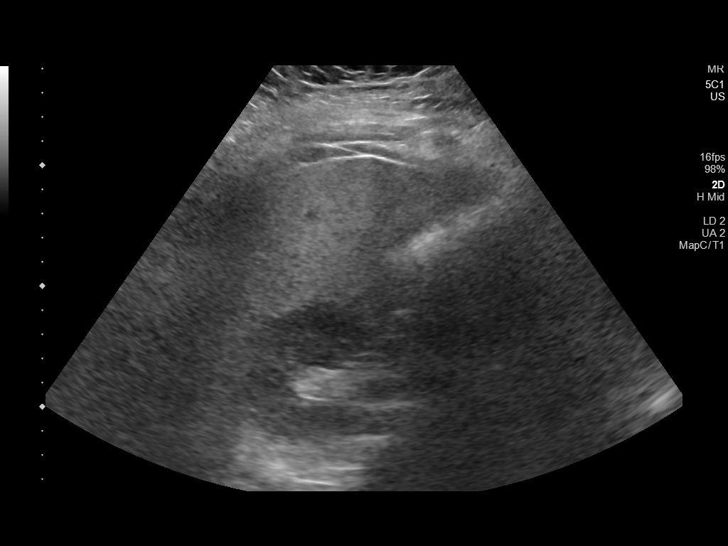
[im 23/31]
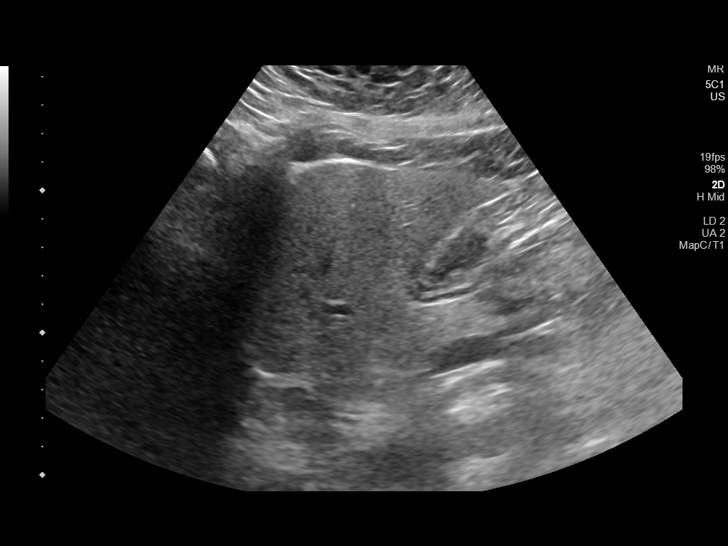
[im 26/31]
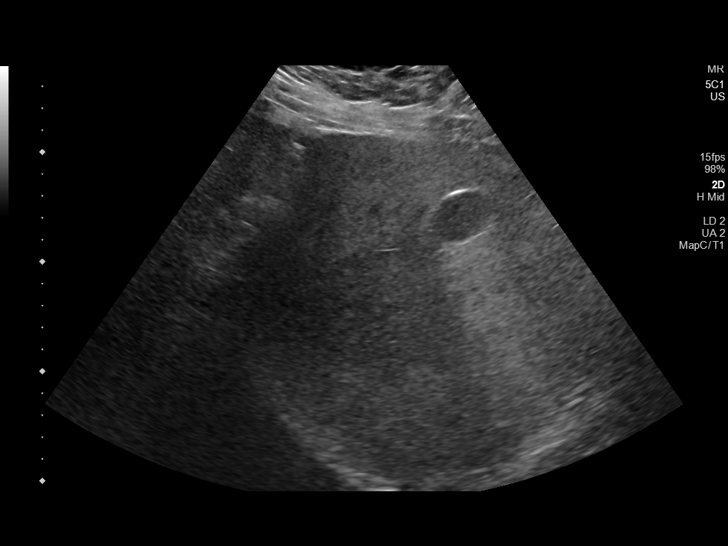
[im 28/31]
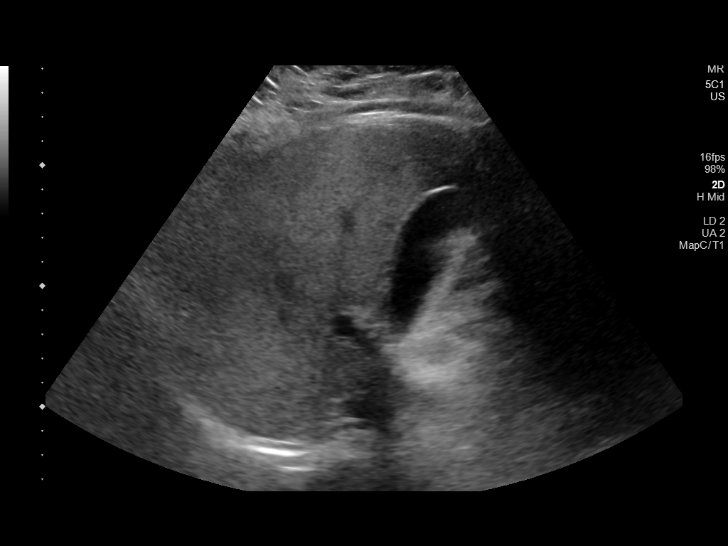
[im 31/31]
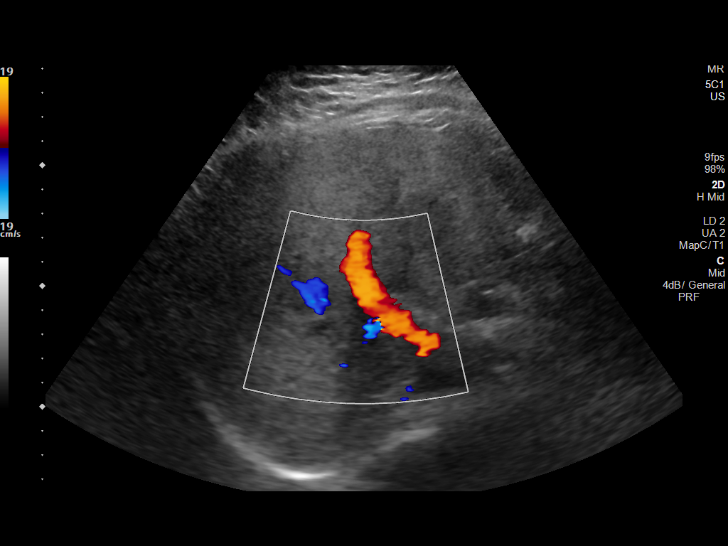

[14 of 25 positions shown; findings below may reference images not displayed]

FINDINGS: Gallbladder:

No gallstones or wall thickening visualized. No sonographic Murphy
sign noted by sonographer.

Common bile duct:

Diameter: 3-4 mm proximally

Liver:

The liver parenchyma is diffusely, mildly heterogeneously increased
in the echotexture is slightly coarsened suggesting changes of mild
to moderate hepatic steatosis. No focal intrahepatic masses are
identified. No intrahepatic biliary ductal dilation is seen. Overall
liver size is within normal limits. Portal vein is patent on color
Doppler imaging with normal direction of blood flow towards the
liver.

Other: No ascites
IMPRESSION: Mild to moderate hepatic steatosis.
